# Patient Record
Sex: Male | Born: 1985
Health system: Southern US, Community
[De-identification: ages and names within clinical notes are randomized; demographics above are authoritative.]

## PROBLEM LIST (undated history)

## (undated) DIAGNOSIS — F329 Major depressive disorder, single episode, unspecified: Secondary | ICD-10-CM

## (undated) DIAGNOSIS — F32A Depression, unspecified: Secondary | ICD-10-CM

## (undated) HISTORY — DX: Depression, unspecified: F32.A

## (undated) HISTORY — PX: APPENDECTOMY: SHX54

## (undated) HISTORY — PX: WISDOM TOOTH EXTRACTION: SHX21

---
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
## 2011-07-28 ENCOUNTER — Emergency Department (HOSPITAL_COMMUNITY): Payer: BC Managed Care – PPO

## 2011-07-28 ENCOUNTER — Emergency Department (HOSPITAL_COMMUNITY)
Admission: EM | Admit: 2011-07-28 | Discharge: 2011-07-28 | Disposition: A | Discharge status: Home / Self Care | Payer: BC Managed Care – PPO | Attending: Emergency Medicine | Admitting: Emergency Medicine

## 2011-07-28 DIAGNOSIS — F17200 Nicotine dependence, unspecified, uncomplicated: Secondary | ICD-10-CM | POA: Insufficient documentation

## 2011-07-28 DIAGNOSIS — R1084 Generalized abdominal pain: Secondary | ICD-10-CM

## 2011-07-28 DIAGNOSIS — R1031 Right lower quadrant pain: Secondary | ICD-10-CM | POA: Insufficient documentation

## 2011-07-28 LAB — DIFFERENTIAL
Eosinophils Absolute: 0.1 10*3/uL (ref 0.0–0.7)
Eosinophils Relative: 2 % (ref 0–5)
Lymphocytes Relative: 18 % (ref 12–46)
Lymphs Abs: 1.5 10*3/uL (ref 0.7–4.0)
Monocytes Absolute: 1.2 10*3/uL — ABNORMAL HIGH (ref 0.1–1.0)
Monocytes Relative: 14 % — ABNORMAL HIGH (ref 3–12)

## 2011-07-28 LAB — CBC
HCT: 46.5 % (ref 39.0–52.0)
Hemoglobin: 16.3 g/dL (ref 13.0–17.0)
MCH: 31.5 pg (ref 26.0–34.0)
MCV: 89.8 fL (ref 78.0–100.0)
RBC: 5.18 MIL/uL (ref 4.22–5.81)
WBC: 8.1 10*3/uL (ref 4.0–10.5)

## 2011-07-28 LAB — URINALYSIS, ROUTINE W REFLEX MICROSCOPIC
Bilirubin Urine: NEGATIVE
Ketones, ur: NEGATIVE mg/dL
Nitrite: NEGATIVE
Specific Gravity, Urine: 1.03 (ref 1.005–1.030)
pH: 6 (ref 5.0–8.0)

## 2011-07-28 LAB — COMPREHENSIVE METABOLIC PANEL
ALT: 34 U/L (ref 0–53)
BUN: 7 mg/dL (ref 6–23)
CO2: 26 mEq/L (ref 19–32)
Calcium: 9.7 mg/dL (ref 8.4–10.5)
GFR calc Af Amer: >60 mL/min (ref >60)
GFR calc non Af Amer: >60 mL/min (ref >60)
Glucose, Bld: 88 mg/dL (ref 70–99)
Sodium: 135 mEq/L (ref 135–145)
Total Protein: 7.7 g/dL (ref 6.0–8.3)

## 2011-07-28 MED ORDER — HYDROCODONE-ACETAMINOPHEN 5-325 MG PO TABS: 1.0000 | ORAL_TABLET | ORAL | Status: AC | PRN

## 2011-07-28 MED ORDER — HYDROMORPHONE HCL 1 MG/ML IJ SOLN
1.0000 mg | Freq: Once | INTRAMUSCULAR | Status: AC
  Administered 2011-07-28: 1 mg via INTRAVENOUS
  Filled 2011-07-28: qty 1

## 2011-07-28 MED ORDER — ONDANSETRON HCL 4 MG/2ML IJ SOLN
4.0000 mg | Freq: Once | INTRAMUSCULAR | Status: AC
  Administered 2011-07-28: 4 mg via INTRAVENOUS
  Filled 2011-07-28: qty 2

## 2011-07-28 MED ORDER — HYDROCODONE-ACETAMINOPHEN 5-325 MG PO TABS: 1.0000 | ORAL_TABLET | ORAL | Status: DC | PRN

## 2011-07-28 MED ORDER — KETOROLAC TROMETHAMINE 30 MG/ML IJ SOLN
30.0000 mg | Freq: Once | INTRAMUSCULAR | Status: AC
  Administered 2011-07-28: 30 mg via INTRAVENOUS
  Filled 2011-07-28: qty 1

## 2011-07-28 MED ORDER — ONDANSETRON HCL 4 MG PO TABS: 4.0000 mg | ORAL_TABLET | Freq: Four times a day (QID) | ORAL | Status: AC

## 2011-07-28 MED ORDER — SODIUM CHLORIDE 0.9 % IV BOLUS (SEPSIS)
1000.0000 mL | Freq: Once | INTRAVENOUS | Status: AC
  Administered 2011-07-28: 1000 mL via INTRAVENOUS

## 2011-07-28 MED ORDER — IOHEXOL 300 MG/ML  SOLN
100.0000 mL | Freq: Once | INTRAMUSCULAR | Status: AC | PRN
  Administered 2011-07-28: 100 mL via INTRAVENOUS

## 2011-07-28 MED ORDER — ONDANSETRON HCL 4 MG PO TABS: 4.0000 mg | ORAL_TABLET | Freq: Four times a day (QID) | ORAL | Status: DC

## 2011-07-28 NOTE — ED Notes (Signed)
Complain of sharp pain in right lower abd

## 2011-07-28 NOTE — ED Notes (Signed)
Left in c/o mother for transport home; a&ox4; in no distress. 

## 2011-07-28 NOTE — ED Provider Notes (Signed)
History   Chart scribed for Donnetta Hutching, MD by Enos Fling; the patient was seen in room APA14/APA14; this patient's care was started at 8:40 AM.    CSN: [id number] Arrival date & time: 07/28/2011  8:19 AM  Chief Complaint  Patient presents with  . Abdominal Pain   HPI Connor Crawford is a 25 y.o. male who presents to the Emergency Department complaining of abd pain. Pt reports RLQ abd pain has been very mild for several days but became much worse yesterday evening when pt woke up from sleeping. Pain is sharp and radiating to right flank and to bilateral testicles. Pt eating normally, no n/v/d or urinary complaints. No fever, chills, diaphoresis.  History reviewed. No pertinent past medical history.  History reviewed. No pertinent past surgical history.  No family history on file.  History  Substance Use Topics  . Smoking status: Current Everyday Smoker  . Smokeless tobacco: Not on file  . Alcohol Use: Yes   Previous Medications   ASCORBIC ACID (VITAMIN C PO)    Take 4 tablets by mouth as needed. Takes when getting sick    DIPHENHYDRAMINE (BENADRYL) 25 MG CAPSULE    Take 50 mg by mouth 2 (two) times daily as needed. For allergies    IBUPROFEN (ADVIL,MOTRIN) 200 MG TABLET    Take 400-600 mg by mouth daily as needed. For pain      Allergies as of 07/28/2011  . (No Known Allergies)       Review of Systems 10 Systems reviewed and are negative for acute change except as noted in the HPI.  Physical Exam  BP 134/79  Pulse 63  Temp(Src) 98.6 F (37 C) (Oral)  Resp 18  Ht 5\' 11"  (1.803 m)  Wt 225 lb (102.059 kg)  BMI 31.38 kg/m2  SpO2 97%  Physical Exam  Nursing note and vitals reviewed. Constitutional: He is oriented to person, place, and time. No distress.       Appearance consistent with age of record  HENT:  Head: Normocephalic and atraumatic.  Right Ear: External ear normal.  Left Ear: External ear normal.  Nose: Nose normal.  Mouth/Throat: Oropharynx is clear  and moist.  Eyes: Conjunctivae are normal.  Neck: Neck supple.  Cardiovascular: Normal rate and regular rhythm.  Exam reveals no gallop and no friction rub.   No murmur heard. Pulmonary/Chest: Effort normal and breath sounds normal. He has no wheezes. He has no rhonchi. He has no rales. He exhibits no tenderness.  Abdominal: Soft. Bowel sounds are normal. There is tenderness (moderate RLQ). There is no rebound and no guarding.       Suprapubic and right flank are nontender  Musculoskeletal: Normal range of motion.       Normal appearance of extremities  Neurological: He is alert and oriented to person, place, and time. No sensory deficit.  Skin: No rash noted.       Color normal  Psychiatric: He has a normal mood and affect.    ED Course  Procedures  OTHER DATA REVIEWED: Nursing notes and vital signs reviewed. Prior records reviewed.   LABS / RADIOLOGY: Results for orders placed during the hospital encounter of 07/28/11  URINALYSIS, ROUTINE W REFLEX MICROSCOPIC      Component Value Range   Color, Urine YELLOW  YELLOW    Appearance CLEAR  CLEAR    Specific Gravity, Urine 1.030  1.005 - 1.030    pH 6.0  5.0 - 8.0    Glucose,  UA NEGATIVE  NEGATIVE (mg/dL)   Hgb urine dipstick TRACE (*) NEGATIVE    Bilirubin Urine NEGATIVE  NEGATIVE    Ketones, ur NEGATIVE  NEGATIVE (mg/dL)   Protein, ur NEGATIVE  NEGATIVE (mg/dL)   Urobilinogen, UA 0.2  0.0 - 1.0 (mg/dL)   Nitrite NEGATIVE  NEGATIVE    Leukocytes, UA NEGATIVE  NEGATIVE   URINE MICROSCOPIC-ADD ON      Component Value Range   RBC / HPF 0-2  <3 (RBC/hpf)  CBC      Component Value Range   WBC 8.1  4.0 - 10.5 (K/uL)   RBC 5.18  4.22 - 5.81 (MIL/uL)   Hemoglobin 16.3  13.0 - 17.0 (g/dL)   HCT 16.1  09.6 - 04.5 (%)   MCV 89.8  78.0 - 100.0 (fL)   MCH 31.5  26.0 - 34.0 (pg)   MCHC 35.1  30.0 - 36.0 (g/dL)   RDW 40.9  81.1 - 91.4 (%)   Platelets 227  150 - 400 (K/uL)  DIFFERENTIAL      Component Value Range   Neutrophils  Relative 66  43 - 77 (%)   Neutro Abs 5.4  1.7 - 7.7 (K/uL)   Lymphocytes Relative 18  12 - 46 (%)   Lymphs Abs 1.5  0.7 - 4.0 (K/uL)   Monocytes Relative 14 (*) 3 - 12 (%)   Monocytes Absolute 1.2 (*) 0.1 - 1.0 (K/uL)   Eosinophils Relative 2  0 - 5 (%)   Eosinophils Absolute 0.1  0.0 - 0.7 (K/uL)   Basophils Relative 0  0 - 1 (%)   Basophils Absolute 0.0  0.0 - 0.1 (K/uL)  COMPREHENSIVE METABOLIC PANEL      Component Value Range   Sodium 135  135 - 145 (mEq/L)   Potassium 3.8  3.5 - 5.1 (mEq/L)   Chloride 99  96 - 112 (mEq/L)   CO2 26  19 - 32 (mEq/L)   Glucose, Bld 88  70 - 99 (mg/dL)   BUN 7  6 - 23 (mg/dL)   Creatinine, Ser 7.82  0.50 - 1.35 (mg/dL)   Calcium 9.7  8.4 - 95.6 (mg/dL)   Total Protein 7.7  6.0 - 8.3 (g/dL)   Albumin 4.1  3.5 - 5.2 (g/dL)   AST 32  0 - 37 (U/L)   ALT 34  0 - 53 (U/L)   Alkaline Phosphatase 85  39 - 117 (U/L)   Total Bilirubin 0.4  0.3 - 1.2 (mg/dL)   GFR calc non Af Amer >60  >60 (mL/min)   GFR calc Af Amer >60  >60 (mL/min)   Ct Abdomen Pelvis W Contrast  07/28/2011  *RADIOLOGY REPORT*  Clinical Data: Right lower quadrant pain/tenderness, evaluate for appendicitis versus kidney stone  CT ABDOMEN AND PELVIS WITH CONTRAST  Technique:  Multidetector CT imaging of the abdomen and pelvis was performed following the standard protocol during bolus administration of intravenous contrast.  Contrast: 100 ml Omnipaque-300 IV  Comparison: None.  Findings: Lung bases are clear.  Liver, spleen, pancreas, and adrenal glands within normal limits.  Gallbladder is unremarkable.  No intrahepatic or extrahepatic ductal dilatation.  Kidneys within normal limits.  No hydronephrosis.  No evidence of bowel obstruction.  Normal appendix.  Atherosclerotic calcifications of the abdominal aorta and branch vessels.  No abdominopelvic ascites.  No suspicious abdominopelvic lymphadenopathy.  Prostate is unremarkable.  No ureteral or bladder calculi.  Visualized osseous structures  are within normal limits.  IMPRESSION: Normal appendix.  No renal, ureteral, or bladder calculi.  No hydronephrosis.  No evidence of bowel obstruction.  No CT findings to account for the patient's abdominal pain.  Original Report Authenticated By: Charline Bills, M.D.     ED COURSE: 1:00 PM - Pt resting comfortably, pain improved, no complaints. All results reviewed and discussed with pt, questions answered, pt agreeable with plan.  MDM: Blood work, UA, CT scan, IVFs 1:34 PM recheck no acute abdomen. Patient comfortable. Discussed test results with patient. IMPRESSION: No diagnosis found.   PLAN: discharge    CONDITION ON DISCHARGE: stable   MEDS GIVEN IN ED: HYDROmorphone (DILAUDID) injection 1 mg (1 mg Intravenous Given 07/28/11 1008)  ketorolac (TORADOL) injection 30 mg (30 mg Intravenous Given 07/28/11 1007)  ondansetron (ZOFRAN) injection 4 mg (4 mg Intravenous Given 07/28/11 1007)  sodium chloride 0.9 % bolus 1,000 mL (1000 mL Intravenous Given 07/28/11 1006)  iohexol (OMNIPAQUE) 300 MG/ML injection 100 mL (100 mL Intravenous Contrast Given 07/28/11 1057)     DISCHARGE MEDICATIONS: New Prescriptions   No medications on file     SCRIBE ATTESTATION:I personally performed the services described in this documentation, which was scribed in my presence. The recorded information has been reviewed and considered. No att. providers found    Donnetta Hutching, MD 07/29/11 2143

## 2013-03-08 IMAGING — CT CT ABD-PELV W/ CM
2 of 3 series · 16 of 46 positions shown, 18 images · IV contrast (Omnipaque 300)
Comparison: None.

CLINICAL DATA: Right lower quadrant pain/tenderness, evaluate for
appendicitis versus kidney stone

CT ABDOMEN AND PELVIS WITH CONTRAST
TECHNIQUE: Multidetector CT imaging of the abdomen and pelvis was
performed following the standard protocol during bolus
administration of intravenous contrast.
Contrast: 100 ml Tmnipaque-WLL IV

[Series 2: abd_pel_with 5.0 b40f · axial · 0.72mm/px · z∈[+426,+866]mm · 13 of 102 slices shown, 15 images]
[im 7/102  soft-tissue]
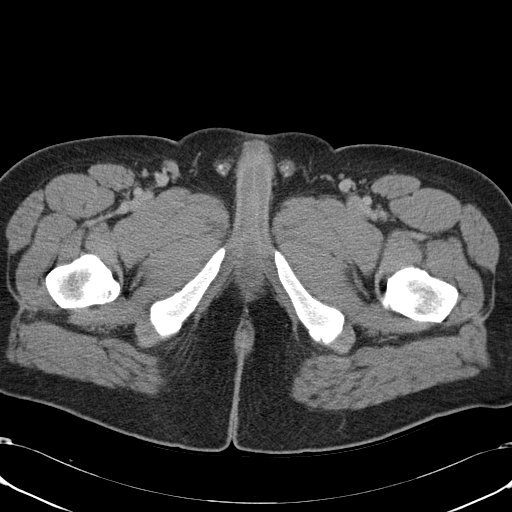
[im 7/102  bone]
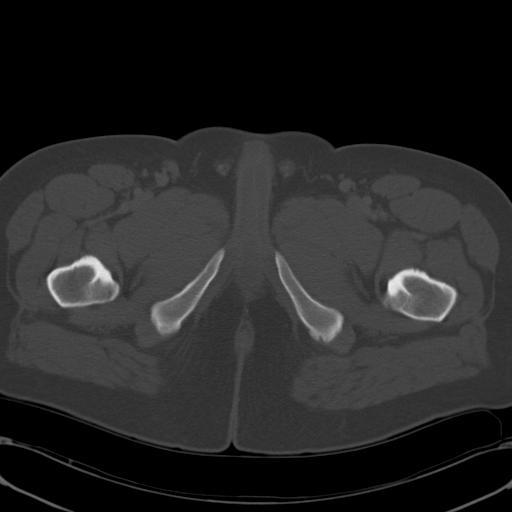
[im 14/102  soft-tissue]
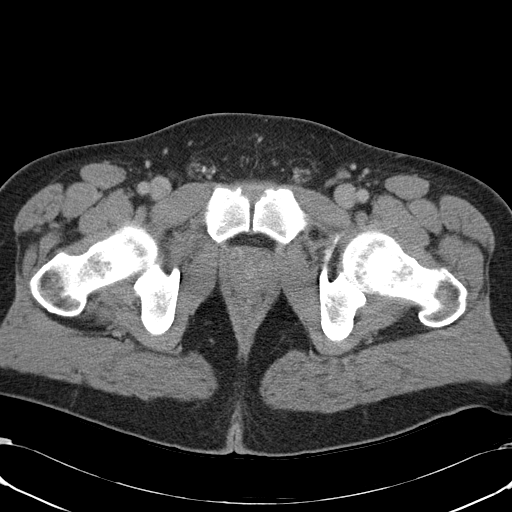
[im 20/102  soft-tissue]
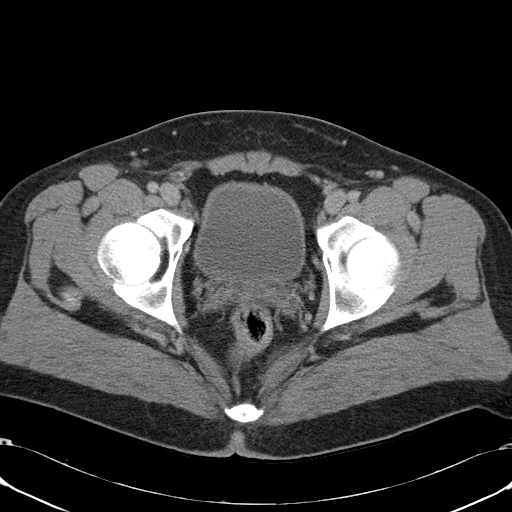
[im 30/102  soft-tissue]
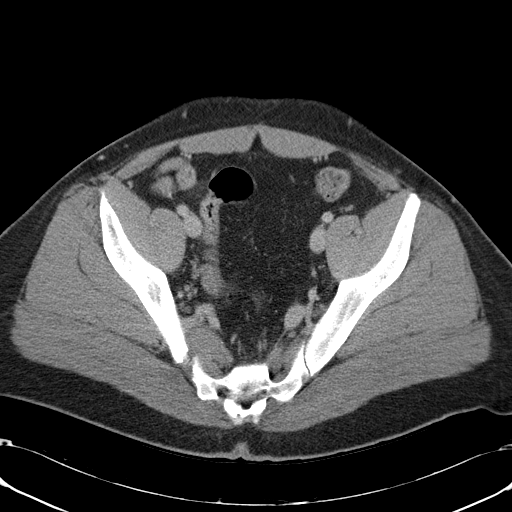
[im 36/102  soft-tissue]
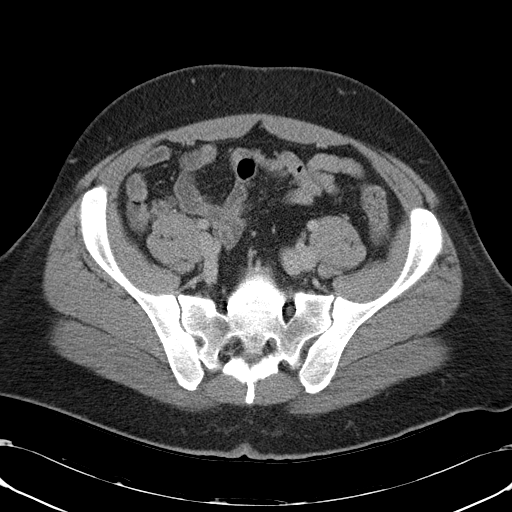
[im 43/102  soft-tissue]
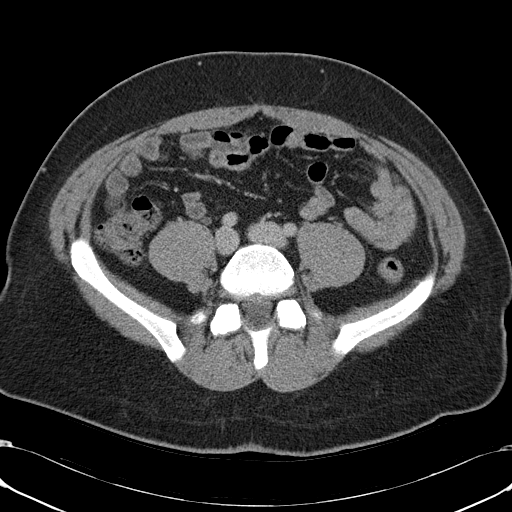
[im 53/102  soft-tissue]
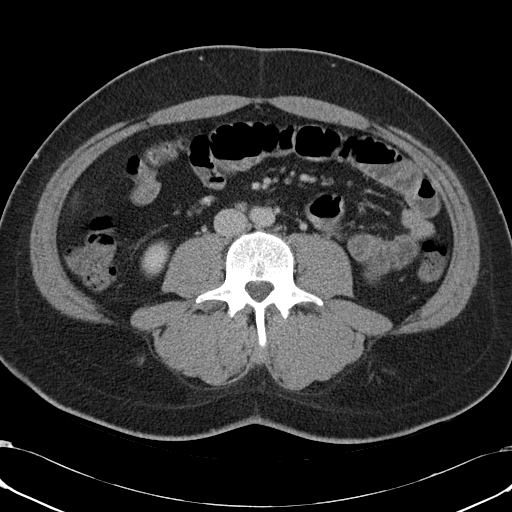
[im 59/102  soft-tissue]
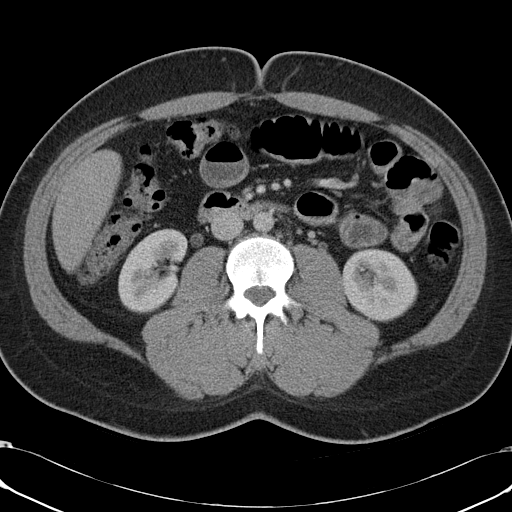
[im 66/102  soft-tissue]
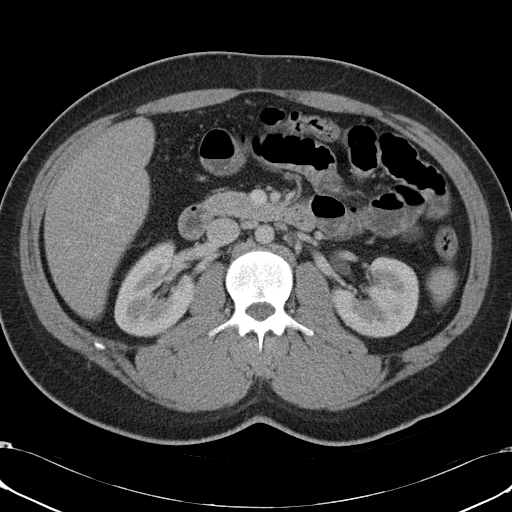
[im 66/102  bone]
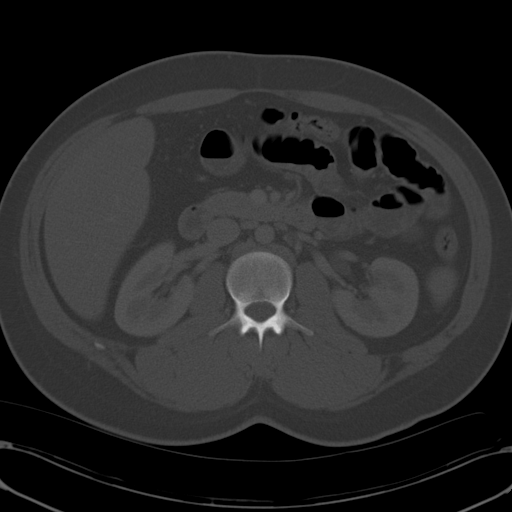
[im 72/102  soft-tissue]
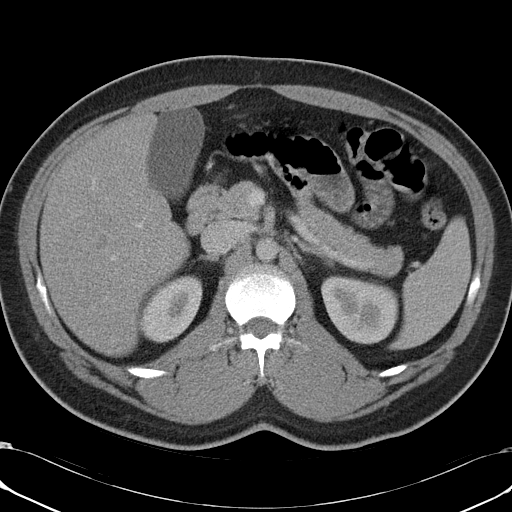
[im 82/102  soft-tissue]
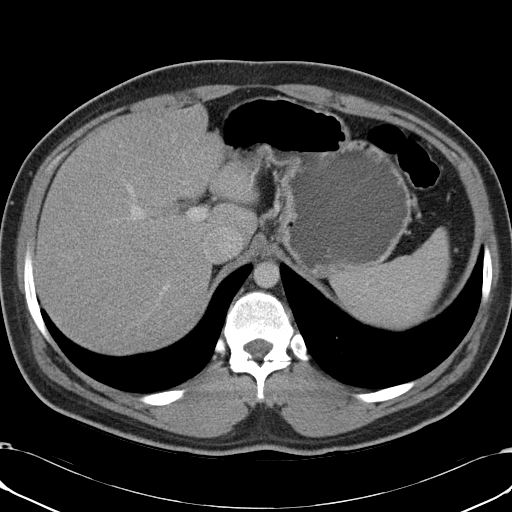
[im 88/102  soft-tissue]
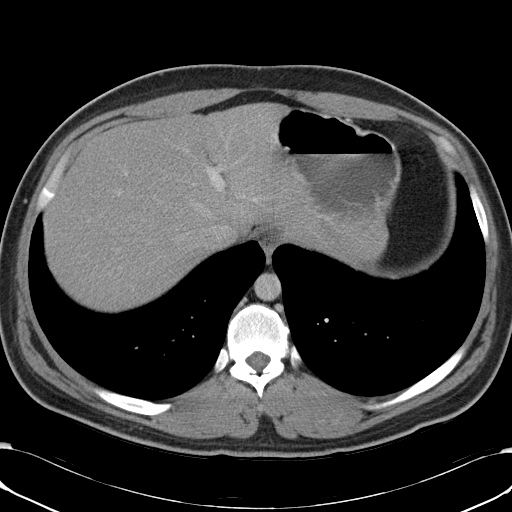
[im 95/102  soft-tissue]
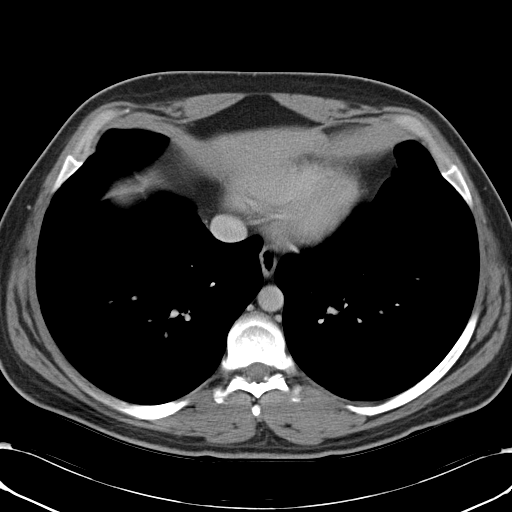

[Series 4: abd_pel_with 3.0 spo cor · coronal · 0.72mm/px · 3 of 91 slices shown]
[im 31/91  soft-tissue]
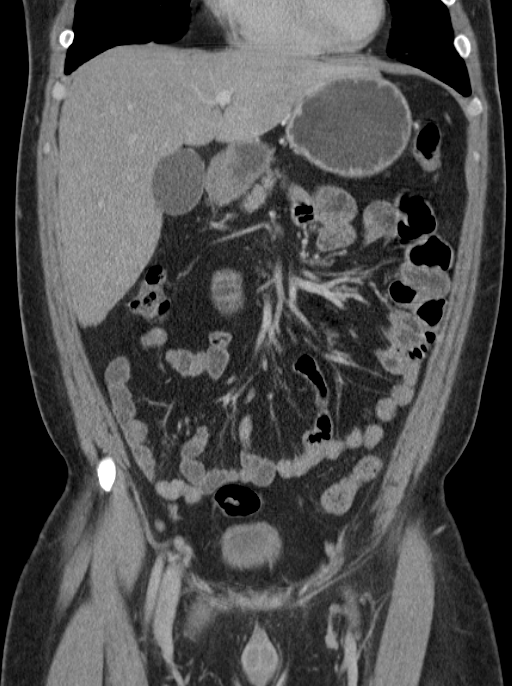
[im 41/91  soft-tissue]
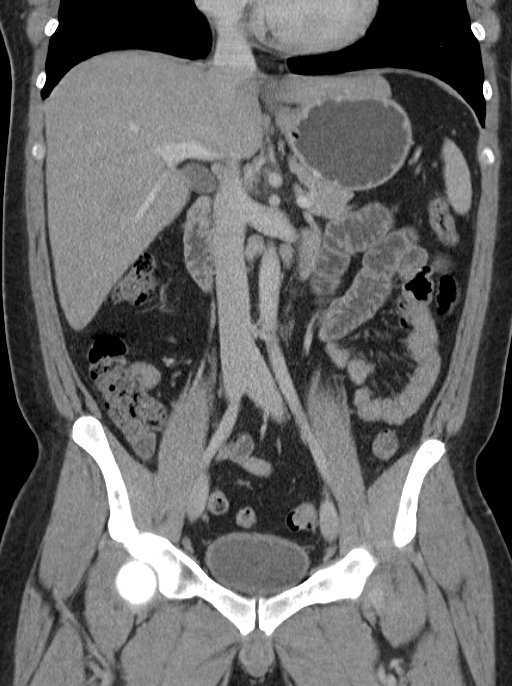
[im 51/91  soft-tissue]
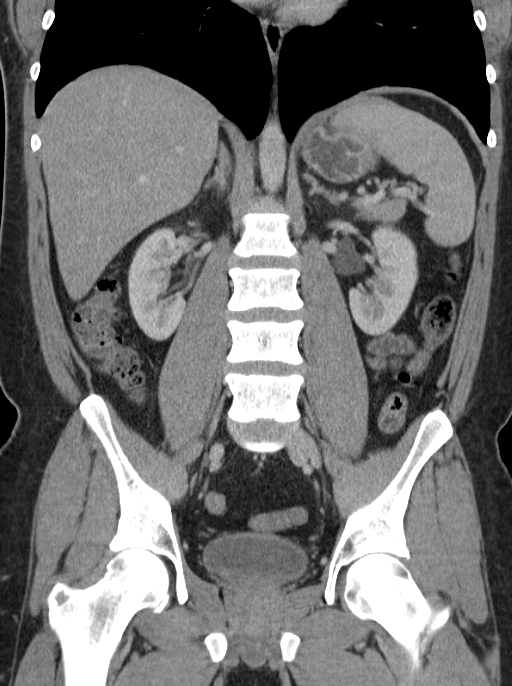

[16 of 46 positions shown; findings below may reference images not displayed]

FINDINGS: Lung bases are clear.

Liver, spleen, pancreas, and adrenal glands within normal limits.

Gallbladder is unremarkable.  No intrahepatic or extrahepatic
ductal dilatation.

Kidneys within normal limits.  No hydronephrosis.

No evidence of bowel obstruction.  Normal appendix.

Atherosclerotic calcifications of the abdominal aorta and branch
vessels.

No abdominopelvic ascites.

No suspicious abdominopelvic lymphadenopathy.

Prostate is unremarkable.

No ureteral or bladder calculi.

Visualized osseous structures are within normal limits.
IMPRESSION: Normal appendix.

No renal, ureteral, or bladder calculi.  No hydronephrosis.

No evidence of bowel obstruction.

No CT findings to account for the patient's abdominal pain.

## 2018-07-04 ENCOUNTER — Encounter: Payer: Self-pay | Admitting: Physician Assistant

## 2018-07-04 ENCOUNTER — Ambulatory Visit (INDEPENDENT_AMBULATORY_CARE_PROVIDER_SITE_OTHER): Payer: Self-pay | Admitting: Physician Assistant

## 2018-07-04 VITALS — BP 138/82 | HR 75 | Temp 98.9°F | Ht 71.0 in | Wt 169.0 lb

## 2018-07-04 DIAGNOSIS — F419 Anxiety disorder, unspecified: Secondary | ICD-10-CM | POA: Insufficient documentation

## 2018-07-04 DIAGNOSIS — F339 Major depressive disorder, recurrent, unspecified: Secondary | ICD-10-CM | POA: Insufficient documentation

## 2018-07-04 DIAGNOSIS — G5603 Carpal tunnel syndrome, bilateral upper limbs: Secondary | ICD-10-CM | POA: Insufficient documentation

## 2018-07-04 MED ORDER — MELOXICAM 7.5 MG PO TABS: 7.5000 mg | ORAL_TABLET | Freq: Every day | ORAL | 0 refills | Status: DC

## 2018-07-04 MED ORDER — CITALOPRAM HYDROBROMIDE 20 MG PO TABS: 20.0000 mg | ORAL_TABLET | Freq: Every day | ORAL | 5 refills | Status: DC

## 2018-07-04 NOTE — Progress Notes (Signed)
BP 138/82   Pulse 75   Temp 98.9 F (37.2 C) (Oral)   Ht 5\' 11"  (1.803 m)   Wt 169 lb (76.7 kg)   BMI 23.57 kg/m    Subjective:    Patient ID: Connor Crawford, male    DOB: 25-Apr-1986, 32 y.o.   MRN: [id number]  HPI: Connor Crawford is a 32 y.o. male presenting on 07/04/2018 for Anxiety and Depression This patient comes in as a new patient to be established with.  He has long-standing issues for depression and anxiety.  He states that he is known that he has had this for probably more than 5 years but due to insurance purposes had never been able to be seen.  He states that he is currently using marijuana nearly daily.  And he will occasionally use pain pills or alcohol.  We had a long discussion that all of these are likely to lower his depression and he should avoid using them as much as possible.  He also has very persistent anxiety.  There are some family history of depression and anxiety.  Is very interested in having treatment.  Depression screen PHQ 2/9 07/04/2018  Decreased Interest 2  Down, Depressed, Hopeless 3  PHQ - 2 Score 5  Altered sleeping 1  Tired, decreased energy 1  Change in appetite 2  Feeling bad or failure about yourself  2  Trouble concentrating 1  Moving slowly or fidgety/restless 1  Suicidal thoughts 2  PHQ-9 Score 15   The patient also reports persistent daily pain in his hands and wrists.  And will be numb and Tingling at nighttime.  I have encouraged him to wear a wrist splint as much as possible.  He does do a job with very repetitious hand movements.  He has never sought treatment for this.  History reviewed. No pertinent past medical history. Relevant past medical, surgical, family and social history reviewed and updated as indicated. Interim medical history since our last visit reviewed. Allergies and medications reviewed and updated. DATA REVIEWED: CHART IN EPIC  Family History reviewed for pertinent findings.  Review of Systems  Constitutional:  Negative.  Negative for appetite change and fatigue.  Eyes: Negative for pain and visual disturbance.  Respiratory: Negative.  Negative for cough, chest tightness, shortness of breath and wheezing.   Cardiovascular: Negative.  Negative for chest pain, palpitations and leg swelling.  Gastrointestinal: Negative.  Negative for abdominal pain, diarrhea, nausea and vomiting.  Genitourinary: Negative.   Musculoskeletal: Positive for arthralgias and joint swelling.  Skin: Negative.  Negative for color change and rash.  Neurological: Negative.  Negative for weakness, numbness and headaches.  Psychiatric/Behavioral: Positive for decreased concentration and dysphoric mood. The patient is nervous/anxious.     Allergies as of 07/04/2018   No Known Allergies     Medication List        Accurate as of 07/04/18  1:07 PM. Always use your most recent med list.          BENADRYL 25 mg capsule Generic drug:  diphenhydrAMINE Take 50 mg by mouth 2 (two) times daily as needed. For allergies   citalopram 20 MG tablet Commonly known as:  CELEXA Take 1 tablet (20 mg total) by mouth daily.   ibuprofen 200 MG tablet Commonly known as:  ADVIL,MOTRIN Take 400-600 mg by mouth daily as needed. For pain   meloxicam 7.5 MG tablet Commonly known as:  MOBIC Take 1 tablet (7.5 mg total) by mouth  daily.   VITAMIN C PO Take 4 tablets by mouth as needed. Takes when getting sick          Objective:    BP 138/82   Pulse 75   Temp 98.9 F (37.2 C) (Oral)   Ht 5\' 11"  (1.803 m)   Wt 169 lb (76.7 kg)   BMI 23.57 kg/m   No Known Allergies  Wt Readings from Last 3 Encounters:  07/04/18 169 lb (76.7 kg)  07/28/11 225 lb (102.1 kg)    Physical Exam  Constitutional: He appears well-developed and well-nourished. No distress.  HENT:  Head: Normocephalic and atraumatic.  Eyes: Pupils are equal, round, and reactive to light. Conjunctivae and EOM are normal.  Cardiovascular: Normal rate, regular rhythm and  normal heart sounds.  Pulmonary/Chest: Effort normal and breath sounds normal. No respiratory distress.  Musculoskeletal:       Right hand: He exhibits tenderness. He exhibits normal range of motion. Normal sensation noted. Normal strength noted.  Positive Phalen's  Skin: Skin is warm and dry.  Psychiatric: He has a normal mood and affect. His behavior is normal.  Nursing note and vitals reviewed.       Assessment & Plan:   1. Depression, recurrent (HCC) - citalopram (CELEXA) 20 MG tablet; Take 1 tablet (20 mg total) by mouth daily.  Dispense: 30 tablet; Refill: 5  2. Carpal tunnel syndrome on both sides - meloxicam (MOBIC) 7.5 MG tablet; Take 1 tablet (7.5 mg total) by mouth daily.  Dispense: 30 tablet; Refill: 0  3. Anxiety - citalopram (CELEXA) 20 MG tablet; Take 1 tablet (20 mg total) by mouth daily.  Dispense: 30 tablet; Refill: 5   Continue all other maintenance medications as listed above.  Follow up plan: Return in about 1 month (around 08/01/2018).  Educational handout given for survey  Remus LofflerAngel S. Anita Laguna PA-C [facility] [address]  [place], [place] [id number] [phone]   07/04/2018, 1:07 PM

## 2018-07-04 NOTE — Patient Instructions (Signed)

## 2018-07-06 ENCOUNTER — Ambulatory Visit: Payer: Self-pay | Admitting: Physician Assistant

## 2018-08-03 ENCOUNTER — Other Ambulatory Visit: Payer: Self-pay | Admitting: Physician Assistant

## 2018-08-03 DIAGNOSIS — G5603 Carpal tunnel syndrome, bilateral upper limbs: Secondary | ICD-10-CM

## 2018-08-08 ENCOUNTER — Ambulatory Visit: Payer: Self-pay | Admitting: Physician Assistant

## 2018-08-09 ENCOUNTER — Encounter: Payer: Self-pay | Admitting: Physician Assistant

## 2018-08-17 ENCOUNTER — Encounter: Payer: Self-pay | Admitting: Physician Assistant

## 2018-08-17 ENCOUNTER — Ambulatory Visit (INDEPENDENT_AMBULATORY_CARE_PROVIDER_SITE_OTHER): Payer: Self-pay | Admitting: Physician Assistant

## 2018-08-17 VITALS — BP 139/89 | HR 70 | Temp 98.3°F | Ht 71.0 in | Wt 174.0 lb

## 2018-08-17 DIAGNOSIS — F339 Major depressive disorder, recurrent, unspecified: Secondary | ICD-10-CM

## 2018-08-17 NOTE — Patient Instructions (Signed)
In a few days you may receive a survey in the mail or online from [employer] regarding your visit with us today. Please take a moment to fill this out. Your feedback is very important to our whole office. It can help us better understand your needs as well as improve your experience and satisfaction. Thank you for taking your time to complete it. We care about you.  Favor Kreh, PA-C  

## 2018-08-17 NOTE — Progress Notes (Signed)
BP 139/89   Pulse 70   Temp 98.3 F (36.8 C) (Oral)   Ht 5\' 11"  (1.803 m)   Wt 174 lb (78.9 kg)   BMI 24.27 kg/m    Subjective:    Patient ID: Connor Crawford, male    DOB: 17-Jul-1986, 32 y.o.   MRN: [id number]  HPI: Connor Crawford is a 32 y.o. male presenting on 08/17/2018 for Depression (1 month ) This patient returns for a recheck on jis depression. He reports that he has had an improvement in how he feels overall and wants to continue his medications. He does not want to change anything at this time. Depression screen [facility] 2/9 08/17/2018 07/04/2018  Decreased Interest 2 2  Down, Depressed, Hopeless 1 3  PHQ - 2 Score 3 5  Altered sleeping 1 1  Tired, decreased energy 1 1  Change in appetite 3 2  Feeling bad or failure about yourself  2 2  Trouble concentrating 1 1  Moving slowly or fidgety/restless 0 1  Suicidal thoughts 1 2  PHQ-9 Score 12 15      History reviewed. No pertinent past medical history. Relevant past medical, surgical, family and social history reviewed and updated as indicated. Interim medical history since our last visit reviewed. Allergies and medications reviewed and updated. DATA REVIEWED: CHART IN EPIC  Family History reviewed for pertinent findings.  Review of Systems  Constitutional: Negative.  Negative for appetite change and fatigue.  Eyes: Negative for pain and visual disturbance.  Respiratory: Negative.  Negative for cough, chest tightness, shortness of breath and wheezing.   Cardiovascular: Negative.  Negative for chest pain, palpitations and leg swelling.  Gastrointestinal: Negative.  Negative for abdominal pain, diarrhea, nausea and vomiting.  Genitourinary: Negative.   Skin: Negative.  Negative for color change and rash.  Neurological: Negative.  Negative for weakness, numbness and headaches.  Psychiatric/Behavioral: Negative.     Allergies as of 08/17/2018   No Known Allergies     Medication List        Accurate as of 08/17/18  11:59 PM. Always use your most recent med list.          BENADRYL 25 mg capsule Generic drug:  diphenhydrAMINE Take 50 mg by mouth 2 (two) times daily as needed. For allergies   citalopram 20 MG tablet Commonly known as:  CELEXA Take 1 tablet (20 mg total) by mouth daily.   ibuprofen 200 MG tablet Commonly known as:  ADVIL,MOTRIN Take 400-600 mg by mouth daily as needed. For pain   meloxicam 7.5 MG tablet Commonly known as:  MOBIC TAKE 1 TABLET BY MOUTH ONCE DAILY   VITAMIN C PO Take 4 tablets by mouth as needed. Takes when getting sick          Objective:    BP 139/89   Pulse 70   Temp 98.3 F (36.8 C) (Oral)   Ht 5\' 11"  (1.803 m)   Wt 174 lb (78.9 kg)   BMI 24.27 kg/m   No Known Allergies  Wt Readings from Last 3 Encounters:  08/17/18 174 lb (78.9 kg)  07/04/18 169 lb (76.7 kg)  07/28/11 225 lb (102.1 kg)    Physical Exam  Constitutional: He appears well-developed and well-nourished. No distress.  HENT:  Head: Normocephalic and atraumatic.  Eyes: Pupils are equal, round, and reactive to light. Conjunctivae and EOM are normal.  Cardiovascular: Normal rate, regular rhythm and normal heart sounds.  Pulmonary/Chest: Effort normal and breath  sounds normal. No respiratory distress.  Skin: Skin is warm and dry.  Psychiatric: He has a normal mood and affect. His behavior is normal.  Nursing note and vitals reviewed.       Assessment & Plan:   1. Depression, recurrent (HCC) Continue celexa and recheck 1 month   Continue all other maintenance medications as listed above.  Follow up plan: No follow-ups on file.  Educational handout given for survey  Remus LofflerAngel S. Crissie Aloi PA-C [facility] [address]  [place], [place] [address] [phone]   08/18/2018, 9:45 PM

## 2018-08-26 ENCOUNTER — Telehealth: Payer: Self-pay | Admitting: Physician Assistant

## 2018-08-26 NOTE — Telephone Encounter (Signed)
[name] not in today - attempted to call mom and let her know that it will be MON = NA , No VM -jhb 08/26/18.

## 2018-08-28 ENCOUNTER — Other Ambulatory Visit: Payer: Self-pay | Admitting: Physician Assistant

## 2018-08-28 DIAGNOSIS — F339 Major depressive disorder, recurrent, unspecified: Secondary | ICD-10-CM

## 2018-08-28 DIAGNOSIS — F419 Anxiety disorder, unspecified: Secondary | ICD-10-CM

## 2018-08-28 MED ORDER — CITALOPRAM HYDROBROMIDE 40 MG PO TABS: 20.0000 mg | ORAL_TABLET | Freq: Every day | ORAL | 5 refills | Status: DC

## 2018-08-28 NOTE — Telephone Encounter (Signed)
sent 

## 2018-08-29 MED ORDER — CITALOPRAM HYDROBROMIDE 40 MG PO TABS: 40.0000 mg | ORAL_TABLET | Freq: Every day | ORAL | 5 refills | Status: DC

## 2018-08-29 NOTE — Telephone Encounter (Signed)
Patient aware.

## 2018-09-26 ENCOUNTER — Telehealth: Payer: Self-pay | Admitting: Physician Assistant

## 2018-09-26 NOTE — Telephone Encounter (Signed)
Patients mother called in stating patient is threatening to kill himself. She states that there is one gun in the home but she has it hidden and in several pieces. He does have a knife collection scattered all through the home. She states he has been drinking more and using pain killers more as well. He lives in the basement with his mother. He has 4 pit bull dogs in the home with him. I advised mother that she needed to transport him to the hospital and she said he would refuse to go. I advised her that she could call 911 and if she was not comfortable that I would call 911 for her. 911 called at 11:22am and information given to dispatch and they have officers on the way to the home now. Mother advised.

## 2018-09-27 ENCOUNTER — Ambulatory Visit (INDEPENDENT_AMBULATORY_CARE_PROVIDER_SITE_OTHER): Payer: Self-pay | Admitting: Physician Assistant

## 2018-09-27 ENCOUNTER — Telehealth: Payer: Self-pay | Admitting: Physician Assistant

## 2018-09-27 ENCOUNTER — Encounter: Payer: Self-pay | Admitting: Physician Assistant

## 2018-09-27 VITALS — BP 142/92 | HR 84 | Ht 71.0 in | Wt 179.0 lb

## 2018-09-27 DIAGNOSIS — F39 Unspecified mood [affective] disorder: Secondary | ICD-10-CM

## 2018-09-27 DIAGNOSIS — F339 Major depressive disorder, recurrent, unspecified: Secondary | ICD-10-CM

## 2018-09-27 MED ORDER — RISPERIDONE 0.5 MG PO TABS: 0.5000 mg | ORAL_TABLET | Freq: Every day | ORAL | 1 refills | Status: DC

## 2018-09-27 NOTE — Progress Notes (Signed)
MOOD DISORDER QUESTIONNAIRE  Has there ever been a period of time when you were not your usual self and ... - you felt so good or so hyper that other people thought you were not your normal self or you were so hyper that you got into trouble?  No  - you were so irritable that you shouted at people or started fights or arguments? Yes  - you felt much more self-confident than usual? Yes  - you got much less sleep than usual and found that you didn't really miss it? Yes  - you were more talkative or spoke much faster than usual? Yes  - thoughts raced through your head or you couldn't slow your mind down? Yes  - you were so easily distracted by things around you that you had trouble concentrating or stay on track? Yes  -you had much more energy than usual? Yes  - you were much more active or did many more things than usual? Yes  - you were much more social or outgoing than usual, for example, you telephoned friends in the middle of the night? No  -you were much more interested in sex than usual? No  -you did things that were unusual for you or that other people might have thought were excessive, foolish or risky? Yes  -spending money got you or your family in trouble? Yes     If you checked yes for more than one of the above, have several of these ever happened during the same period of time? Yes  How much of a problem did any of these cause you- like being able to work; having family, money or legal troubles; getting in arguments or fights? yes  Have any of your blood relatives had manic-depressive illness or bipolar disorder?  Yes   Has a health professional ever told you that you have manic-depressive illness or bipolar disorder? No   Depression screen [facility] 2/9 09/27/2018 08/17/2018 07/04/2018  Decreased Interest 3 2 2   Down, Depressed, Hopeless 3 1 3   PHQ - 2 Score 6 3 5   Altered sleeping 2 1 1   Tired, decreased energy 1 1 1   Change in appetite 2 3 2   Feeling bad or failure  about yourself  3 2 2   Trouble concentrating 1 1 1   Moving slowly or fidgety/restless 1 0 1  Suicidal thoughts 2 1 2   PHQ-9 Score 18 12 15      BP (!) 142/92   Pulse 84   Ht 5\' 11"  (1.803 m)   Wt 179 lb (81.2 kg)   BMI 24.97 kg/m    Subjective:    Patient ID: Connor Crawford, male    DOB: January 01, 1986, 32 y.o.   MRN: [id number]  HPI: Connor Crawford is a 32 y.o. male presenting on 09/27/2018 for Depression (Feels like celexa is making it worse.) Positive MDQ and PHQ screen in today's visit. He commits to safety and will call mental health number or allow fa,ily to call if he feesl unsafe  History reviewed. No pertinent past medical history. Relevant past medical, surgical, family and social history reviewed and updated as indicated. Interim medical history since our last visit reviewed. Allergies and medications reviewed and updated. DATA REVIEWED: CHART IN EPIC  Family History reviewed for pertinent findings.  Review of Systems  Constitutional: Negative.  Negative for appetite change and fatigue.  Eyes: Negative for pain and visual disturbance.  Respiratory: Negative.  Negative for cough, chest tightness, shortness of breath and wheezing.  Cardiovascular: Negative.  Negative for chest pain, palpitations and leg swelling.  Gastrointestinal: Negative.  Negative for abdominal pain, diarrhea, nausea and vomiting.  Genitourinary: Negative.   Skin: Negative.  Negative for color change and rash.  Neurological: Negative.  Negative for weakness, numbness and headaches.  Psychiatric/Behavioral: Positive for decreased concentration, dysphoric mood, sleep disturbance and suicidal ideas. The patient is nervous/anxious.     Allergies as of 09/27/2018   No Known Allergies     Medication List        Accurate as of 09/27/18 11:59 PM. Always use your most recent med list.          BENADRYL 25 mg capsule Generic drug:  diphenhydrAMINE Take 50 mg by mouth 2 (two) times daily as needed.  For allergies   citalopram 40 MG tablet Commonly known as:  CELEXA Take 1 tablet (40 mg total) by mouth daily.   ibuprofen 200 MG tablet Commonly known as:  ADVIL,MOTRIN Take 400-600 mg by mouth daily as needed. For pain   meloxicam 7.5 MG tablet Commonly known as:  MOBIC TAKE 1 TABLET BY MOUTH ONCE DAILY   risperiDONE 0.5 MG tablet Commonly known as:  RISPERDAL Take 1 tablet (0.5 mg total) by mouth at bedtime.   VITAMIN C PO Take 4 tablets by mouth as needed. Takes when getting sick          Objective:    BP (!) 142/92   Pulse 84   Ht 5\' 11"  (1.803 m)   Wt 179 lb (81.2 kg)   BMI 24.97 kg/m   No Known Allergies  Wt Readings from Last 3 Encounters:  09/27/18 179 lb (81.2 kg)  08/17/18 174 lb (78.9 kg)  07/04/18 169 lb (76.7 kg)    Physical Exam  Constitutional: He appears well-developed and well-nourished. No distress.  HENT:  Head: Normocephalic and atraumatic.  Eyes: Pupils are equal, round, and reactive to light. Conjunctivae and EOM are normal.  Cardiovascular: Normal rate, regular rhythm and normal heart sounds.  Pulmonary/Chest: Effort normal and breath sounds normal. No respiratory distress.  Skin: Skin is warm and dry.  Psychiatric: His speech is normal. He is slowed. He exhibits a depressed mood.  Nursing note and vitals reviewed.       Assessment & Plan:   1. Depression, recurrent (HCC) - risperiDONE (RISPERDAL) 0.5 MG tablet; Take 1 tablet (0.5 mg total) by mouth at bedtime.  Dispense: 60 tablet; Refill: 1  2. Mood disorder (HCC) - risperiDONE (RISPERDAL) 0.5 MG tablet; Take 1 tablet (0.5 mg total) by mouth at bedtime.  Dispense: 60 tablet; Refill: 1   Continue all other maintenance medications as listed above.  Follow up plan: Return in about 2 weeks (around 10/11/2018) for recheck.  Educational handout given for survey  Remus Loffler PA-C [facility] [address]  [place], [place]  [address] [phone]   09/29/2018, 9:57 PM

## 2018-09-27 NOTE — Telephone Encounter (Signed)
Appointment given for today.

## 2018-09-29 DIAGNOSIS — F39 Unspecified mood [affective] disorder: Secondary | ICD-10-CM | POA: Insufficient documentation

## 2018-10-11 ENCOUNTER — Encounter: Payer: Self-pay | Admitting: Physician Assistant

## 2018-10-11 ENCOUNTER — Ambulatory Visit (INDEPENDENT_AMBULATORY_CARE_PROVIDER_SITE_OTHER): Payer: Self-pay | Admitting: Physician Assistant

## 2018-10-11 DIAGNOSIS — F339 Major depressive disorder, recurrent, unspecified: Secondary | ICD-10-CM

## 2018-10-11 DIAGNOSIS — F39 Unspecified mood [affective] disorder: Secondary | ICD-10-CM

## 2018-10-11 MED ORDER — RISPERIDONE 2 MG PO TABS: 2.0000 mg | ORAL_TABLET | Freq: Every day | ORAL | 5 refills | Status: DC

## 2018-10-11 NOTE — Progress Notes (Signed)
BP 132/87   Pulse 68   Temp (!) 97 F (36.1 C) (Oral)   Ht 5\' 11"  (1.803 m)   Wt 179 lb (81.2 kg)   BMI 24.97 kg/m    Subjective:    Patient ID: [name], male    DOB: [birth year], 32 y.o.   MRN: [id number]  HPI: Connor Crawford is a 32 y.o. male presenting on 10/11/2018 for Depression (2 week follow up ) Patient reports a great turnaround in his overall depression and anxiety feelings.  He is sleeping much better, though he still works a nighttime shift.  He is getting blocks of 6 hours of sleep for more.  He states he is feeling much better overall. Depression screen [facility] 2/9 10/11/2018 09/27/2018 08/17/2018 07/04/2018  Decreased Interest 1 3 2 2   Down, Depressed, Hopeless 1 3 1 3   PHQ - 2 Score 2 6 3 5   Altered sleeping 1 2 1 1   Tired, decreased energy 1 1 1 1   Change in appetite 1 2 3 2   Feeling bad or failure about yourself  1 3 2 2   Trouble concentrating 1 1 1 1   Moving slowly or fidgety/restless 1 1 0 1  Suicidal thoughts 1 2 1 2   PHQ-9 Score 9 18 12 15       History reviewed. No pertinent past medical history. Relevant past medical, surgical, family and social history reviewed and updated as indicated. Interim medical history since our last visit reviewed. Allergies and medications reviewed and updated. DATA REVIEWED: CHART IN EPIC  Family History reviewed for pertinent findings.  Review of Systems  Constitutional: Negative.  Negative for appetite change and fatigue.  Eyes: Negative for pain and visual disturbance.  Respiratory: Negative.  Negative for cough, chest tightness, shortness of breath and wheezing.   Cardiovascular: Negative.  Negative for chest pain, palpitations and leg swelling.  Gastrointestinal: Negative.  Negative for abdominal pain, diarrhea, nausea and vomiting.  Genitourinary: Negative.   Skin: Negative.  Negative for color change and rash.  Neurological: Negative.  Negative for weakness, numbness and headaches.  Psychiatric/Behavioral:  Negative.     Allergies as of 10/11/2018   No Known Allergies     Medication List        Accurate as of 10/11/18 11:59 PM. Always use your most recent med list.          BENADRYL 25 mg capsule Generic drug:  diphenhydrAMINE Take 50 mg by mouth 2 (two) times daily as needed. For allergies   citalopram 40 MG tablet Commonly known as:  CELEXA Take 1 tablet (40 mg total) by mouth daily.   ibuprofen 200 MG tablet Commonly known as:  ADVIL,MOTRIN Take 400-600 mg by mouth daily as needed. For pain   meloxicam 7.5 MG tablet Commonly known as:  MOBIC TAKE 1 TABLET BY MOUTH ONCE DAILY   risperiDONE 2 MG tablet Commonly known as:  RISPERDAL Take 1 tablet (2 mg total) by mouth at bedtime.   VITAMIN C PO Take 4 tablets by mouth as needed. Takes when getting sick          Objective:    BP 132/87   Pulse 68   Temp (!) 97 F (36.1 C) (Oral)   Ht 5\' 11"  (1.803 m)   Wt 179 lb (81.2 kg)   BMI 24.97 kg/m   No Known Allergies  Wt Readings from Last 3 Encounters:  10/11/18 179 lb (81.2 kg)  09/27/18 179 lb (81.2 kg)  08/17/18 174 lb (78.9 kg)    Physical Exam  Constitutional: He appears well-developed and well-nourished. No distress.  HENT:  Head: Normocephalic and atraumatic.  Eyes: Pupils are equal, round, and reactive to light. Conjunctivae and EOM are normal.  Cardiovascular: Normal rate, regular rhythm and normal heart sounds.  Pulmonary/Chest: Effort normal and breath sounds normal. No respiratory distress.  Skin: Skin is warm and dry.  Psychiatric: His speech is normal and behavior is normal. His mood appears not anxious. He is not agitated, not aggressive and not hyperactive. He exhibits a depressed mood.  Nursing note and vitals reviewed.       Assessment & Plan:   1. Depression, recurrent (HCC) - risperiDONE (RISPERDAL) 2 MG tablet; Take 1 tablet (2 mg total) by mouth at bedtime.  Dispense: 30 tablet; Refill: 5  2. Mood disorder (HCC) - risperiDONE  (RISPERDAL) 2 MG tablet; Take 1 tablet (2 mg total) by mouth at bedtime.  Dispense: 30 tablet; Refill: 5   Continue all other maintenance medications as listed above.  Follow up plan: No follow-ups on file.  Educational handout given for survey  Remus LofflerAngel S. Giah Fickett PA-C [facility] [address]  [place], [place] [id number] [phone]   10/12/2018, 2:17 PM

## 2018-10-18 ENCOUNTER — Telehealth: Payer: Self-pay | Admitting: Physician Assistant

## 2019-01-13 ENCOUNTER — Ambulatory Visit: Payer: Self-pay | Admitting: Physician Assistant

## 2019-01-13 ENCOUNTER — Encounter: Payer: Self-pay | Admitting: Physician Assistant

## 2019-01-13 DIAGNOSIS — F339 Major depressive disorder, recurrent, unspecified: Secondary | ICD-10-CM

## 2019-01-13 DIAGNOSIS — G5603 Carpal tunnel syndrome, bilateral upper limbs: Secondary | ICD-10-CM

## 2019-01-13 DIAGNOSIS — F39 Unspecified mood [affective] disorder: Secondary | ICD-10-CM

## 2019-01-13 MED ORDER — RISPERIDONE 2 MG PO TABS: 2.0000 mg | ORAL_TABLET | Freq: Every day | ORAL | 5 refills | Status: DC

## 2019-01-13 MED ORDER — MELOXICAM 7.5 MG PO TABS: 7.5000 mg | ORAL_TABLET | Freq: Every day | ORAL | 5 refills | Status: DC

## 2019-01-13 MED ORDER — CITALOPRAM HYDROBROMIDE 40 MG PO TABS: 40.0000 mg | ORAL_TABLET | Freq: Every day | ORAL | 5 refills | Status: DC

## 2019-01-13 NOTE — Progress Notes (Signed)
BP 121/74   Pulse 75   Temp (!) 96.8 F (36 C) (Oral)   Ht 5\' 11"  (1.803 m)   Wt 221 lb 9.6 oz (100.5 kg)   BMI 30.91 kg/m    Subjective:    Patient ID: Connor Crawford, male    DOB: 1986/03/16, 33 y.o.   MRN: [id number]  HPI: Connor Crawford is a 33 y.o. male presenting on 01/13/2019 for Anxiety and Depression  This patient comes in for periodic recheck on medications and conditions including depression and mood disorder.  He reports that he is feeling better.    Depression screen [facility] 2/9 01/13/2019 10/11/2018 09/27/2018 08/17/2018 07/04/2018  Decreased Interest 1 1 3 2 2   Down, Depressed, Hopeless 1 1 3 1 3   PHQ - 2 Score 2 2 6 3 5   Altered sleeping 0 1 2 1 1   Tired, decreased energy 1 1 1 1 1   Change in appetite 1 1 2 3 2   Feeling bad or failure about yourself  1 1 3 2 2   Trouble concentrating 0 1 1 1 1   Moving slowly or fidgety/restless 0 1 1 0 1  Suicidal thoughts 0 1 2 1 2   PHQ-9 Score 5 9 18 12 15      All medications are reviewed today. There are no reports of any problems with the medications. All of the medical conditions are reviewed and updated.  Lab work is reviewed and will be ordered as medically necessary. There are no new problems reported with today's visit.  History reviewed. No pertinent past medical history. Relevant past medical, surgical, family and social history reviewed and updated as indicated. Interim medical history since our last visit reviewed. Allergies and medications reviewed and updated. DATA REVIEWED: CHART IN EPIC  Family History reviewed for pertinent findings.  Review of Systems  Constitutional: Negative.  Negative for appetite change and fatigue.  Eyes: Negative for pain and visual disturbance.  Respiratory: Negative.  Negative for cough, chest tightness, shortness of breath and wheezing.   Cardiovascular: Negative.  Negative for chest pain, palpitations and leg swelling.  Gastrointestinal: Negative.  Negative for abdominal pain,  diarrhea, nausea and vomiting.  Genitourinary: Negative.   Skin: Negative.  Negative for color change and rash.  Neurological: Negative.  Negative for weakness, numbness and headaches.  Psychiatric/Behavioral: Negative.     Allergies as of 01/13/2019   No Known Allergies     Medication List       Accurate as of January 13, 2019  9:29 AM. Always use your most recent med list.        BENADRYL 25 mg capsule Generic drug:  diphenhydrAMINE Take 50 mg by mouth 2 (two) times daily as needed. For allergies   citalopram 40 MG tablet Commonly known as:  CELEXA Take 1 tablet (40 mg total) by mouth daily.   ibuprofen 200 MG tablet Commonly known as:  ADVIL,MOTRIN Take 400-600 mg by mouth daily as needed. For pain   meloxicam 7.5 MG tablet Commonly known as:  MOBIC Take 1 tablet (7.5 mg total) by mouth daily.   risperiDONE 2 MG tablet Commonly known as:  RISPERDAL Take 1 tablet (2 mg total) by mouth at bedtime.   VITAMIN C PO Take 4 tablets by mouth as needed. Takes when getting sick          Objective:    BP 121/74   Pulse 75   Temp (!) 96.8 F (36 C) (Oral)   Ht 5\' 11"  (  1.803 m)   Wt 221 lb 9.6 oz (100.5 kg)   BMI 30.91 kg/m   No Known Allergies  Wt Readings from Last 3 Encounters:  01/13/19 221 lb 9.6 oz (100.5 kg)  10/11/18 179 lb (81.2 kg)  09/27/18 179 lb (81.2 kg)    Physical Exam Constitutional:      Appearance: He is well-developed.  HENT:     Head: Normocephalic and atraumatic.     Right Ear: Hearing and tympanic membrane normal.     Left Ear: Hearing and tympanic membrane normal.     Nose: Mucosal edema present. No nasal deformity.     Right Sinus: Frontal sinus tenderness present.     Left Sinus: Frontal sinus tenderness present.     Mouth/Throat:     Pharynx: Posterior oropharyngeal erythema present.  Eyes:     General:        Right eye: No discharge.        Left eye: No discharge.     Conjunctiva/sclera: Conjunctivae normal.     Pupils:  Pupils are equal, round, and reactive to light.  Neck:     Musculoskeletal: Normal range of motion and neck supple.  Cardiovascular:     Rate and Rhythm: Normal rate and regular rhythm.     Heart sounds: Normal heart sounds.  Pulmonary:     Effort: Pulmonary effort is normal. No respiratory distress.     Breath sounds: Wheezing present. No decreased breath sounds, rhonchi or rales.  Abdominal:     General: Bowel sounds are normal.     Palpations: Abdomen is soft.  Musculoskeletal: Normal range of motion.  Skin:    General: Skin is warm and dry.         Assessment & Plan:   1. Depression, recurrent (HCC) - citalopram (CELEXA) 40 MG tablet; Take 1 tablet (40 mg total) by mouth daily.  Dispense: 30 tablet; Refill: 5 - risperiDONE (RISPERDAL) 2 MG tablet; Take 1 tablet (2 mg total) by mouth at bedtime.  Dispense: 30 tablet; Refill: 5  2. Mood disorder (HCC) - risperiDONE (RISPERDAL) 2 MG tablet; Take 1 tablet (2 mg total) by mouth at bedtime.  Dispense: 30 tablet; Refill: 5  3. Carpal tunnel syndrome on both sides - meloxicam (MOBIC) 7.5 MG tablet; Take 1 tablet (7.5 mg total) by mouth daily.  Dispense: 30 tablet; Refill: 5   Continue all other maintenance medications as listed above.  Follow up plan: No follow-ups on file.  Educational handout given for survey  Remus Loffler PA-C [facility] [address]  [place], [place] [address] [phone]   01/13/2019, 9:29 AM

## 2019-03-15 ENCOUNTER — Other Ambulatory Visit: Payer: Self-pay | Admitting: Physician Assistant

## 2019-03-15 DIAGNOSIS — F339 Major depressive disorder, recurrent, unspecified: Secondary | ICD-10-CM

## 2019-03-15 DIAGNOSIS — F419 Anxiety disorder, unspecified: Secondary | ICD-10-CM

## 2019-03-20 ENCOUNTER — Telehealth: Payer: Self-pay | Admitting: Physician Assistant

## 2019-03-20 NOTE — Telephone Encounter (Signed)
What is the name of the medication? citalopram (CELEXA) 40 MG tablet  Have you contacted your pharmacy to request a refill? Yes he was told he has no refills but was refilled in February with 5 refills  Which pharmacy would you like this sent to? [facility] [place]  Patient notified that their request is being sent to the clinical staff for review and that they should receive a call once it is complete. If they do not receive a call within 24 hours they can check with their pharmacy or our office.

## 2019-03-21 ENCOUNTER — Other Ambulatory Visit: Payer: Self-pay | Admitting: Physician Assistant

## 2019-03-21 DIAGNOSIS — F339 Major depressive disorder, recurrent, unspecified: Secondary | ICD-10-CM

## 2019-03-21 DIAGNOSIS — F419 Anxiety disorder, unspecified: Secondary | ICD-10-CM

## 2019-03-21 NOTE — Telephone Encounter (Signed)
Pt was calling pharmacy using an old Rx bottle for 20 mg tablets, will call back asking for refill by name & 40 mg which is the strength the patients has been on since September.

## 2019-07-14 ENCOUNTER — Telehealth: Payer: Self-pay | Admitting: Physician Assistant

## 2019-07-14 NOTE — Telephone Encounter (Signed)
Pt cleared to come in to office

## 2019-07-17 ENCOUNTER — Ambulatory Visit (INDEPENDENT_AMBULATORY_CARE_PROVIDER_SITE_OTHER): Payer: Self-pay | Admitting: Physician Assistant

## 2019-07-17 ENCOUNTER — Other Ambulatory Visit: Payer: Self-pay

## 2019-07-17 ENCOUNTER — Encounter: Payer: Self-pay | Admitting: Physician Assistant

## 2019-07-17 DIAGNOSIS — F339 Major depressive disorder, recurrent, unspecified: Secondary | ICD-10-CM

## 2019-07-17 DIAGNOSIS — F39 Unspecified mood [affective] disorder: Secondary | ICD-10-CM

## 2019-07-17 MED ORDER — RISPERIDONE 2 MG PO TABS: 2.0000 mg | ORAL_TABLET | Freq: Every day | ORAL | 5 refills | Status: AC

## 2019-07-17 MED ORDER — BUPROPION HCL ER (XL) 150 MG PO TB24: 150.0000 mg | ORAL_TABLET | Freq: Every day | ORAL | 5 refills | Status: AC

## 2019-07-17 MED ORDER — CITALOPRAM HYDROBROMIDE 40 MG PO TABS: 40.0000 mg | ORAL_TABLET | Freq: Every day | ORAL | 5 refills | Status: AC

## 2019-07-17 NOTE — Progress Notes (Signed)
BP 130/89   Pulse 76   Temp 97.8 F (36.6 C) (Oral)   Ht 5\' 11"  (1.803 m)   Wt 250 lb (113.4 kg)   BMI 34.87 kg/m    Subjective:    Patient ID: Connor Crawford, male    DOB: 10-10-1986, 33 y.o.   MRN: [id number]  HPI: Connor KnowsJesse D Sturgill is a 33 y.o. male presenting on 07/17/2019 for Medical Management of Chronic Issues  This patient comes in for recheck on his depression.  He states that overall he is feeling better as far as depression goes.  However he still has some lack of motivation and energy.  He has never taken Wellbutrin in the past.  We have discussed that this medicine works on dopamine and may help those symptoms that he just described.  We will send the prescription in and ask him to take it in the morning time.  We will recheck him in 4 weeks.  [facility] 07/17/2019 01/13/2019 10/11/2018 09/27/2018 08/17/2018  Decreased Interest 1 1 1 3 2   Down, Depressed, Hopeless 0 1 1 3 1   PHQ - 2 Score 1 2 2 6 3   Altered sleeping 0 0 1 2 1   Tired, decreased energy 2 1 1 1 1   Change in appetite 0 1 1 2 3   Feeling bad or failure about yourself  0 1 1 3 2   Trouble concentrating 1 0 1 1 1   Moving slowly or fidgety/restless 0 0 1 1 0  Suicidal thoughts 0 0 1 2 1   PHQ-9 Score 4 5 9 18 12     Past Medical History:  Diagnosis Date  . Depression    Relevant past medical, surgical, family and social history reviewed and updated as indicated. Interim medical history since our last visit reviewed. Allergies and medications reviewed and updated. DATA REVIEWED: CHART IN EPIC  Family History reviewed for pertinent findings.  Review of Systems  Constitutional: Negative.  Negative for appetite change and fatigue.  Eyes: Negative for pain and visual disturbance.  Respiratory: Negative.  Negative for cough, chest tightness, shortness of breath and wheezing.   Cardiovascular: Negative.  Negative for chest pain, palpitations and leg swelling.  Gastrointestinal: Negative.  Negative  for abdominal pain, diarrhea, nausea and vomiting.  Genitourinary: Negative.   Skin: Negative.  Negative for color change and rash.  Neurological: Negative.  Negative for weakness, numbness and headaches.  Psychiatric/Behavioral: Negative.     Allergies as of 07/17/2019   No Known Allergies     Medication List       Accurate as of July 17, 2019  8:22 AM. If you have any questions, ask your nurse or doctor.        STOP taking these medications   Benadryl 25 mg capsule Generic drug: diphenhydrAMINE Stopped by: Remus LofflerAngel S Jaymar Loeber, PA-C   VITAMIN C PO Stopped by: Remus LofflerAngel S Manette Doto, PA-C     TAKE these medications   buPROPion 150 MG 24 hr tablet Commonly known as: Wellbutrin XL Take 1 tablet (150 mg total) by mouth daily. Started by: Remus LofflerAngel S Zykiria Bruening, PA-C   citalopram 40 MG tablet Commonly known as: CeleXA Take 1 tablet (40 mg total) by mouth daily.   ibuprofen 200 MG tablet Commonly known as: ADVIL Take 400-600 mg by mouth daily as needed. For pain   meloxicam 7.5 MG tablet Commonly known as: MOBIC Take 1 tablet (7.5 mg total) by mouth daily.   risperiDONE 2 MG tablet Commonly  known as: RISPERDAL Take 1 tablet (2 mg total) by mouth at bedtime.          Objective:    BP 130/89   Pulse 76   Temp 97.8 F (36.6 C) (Oral)   Ht 5\' 11"  (1.803 m)   Wt 250 lb (113.4 kg)   BMI 34.87 kg/m   No Known Allergies  Wt Readings from Last 3 Encounters:  07/17/19 250 lb (113.4 kg)  01/13/19 221 lb 9.6 oz (100.5 kg)  10/11/18 179 lb (81.2 kg)    Physical Exam Vitals signs and nursing note reviewed.  Constitutional:      General: He is not in acute distress.    Appearance: He is well-developed.  HENT:     Head: Normocephalic and atraumatic.  Eyes:     Conjunctiva/sclera: Conjunctivae normal.     Pupils: Pupils are equal, round, and reactive to light.  Cardiovascular:     Rate and Rhythm: Normal rate and regular rhythm.     Heart sounds: Normal heart sounds.  Pulmonary:      Effort: Pulmonary effort is normal. No respiratory distress.     Breath sounds: Normal breath sounds.  Skin:    General: Skin is warm and dry.  Psychiatric:        Behavior: Behavior normal.         Assessment & Plan:   1. Depression, recurrent (HCC) - buPROPion (WELLBUTRIN XL) 150 MG 24 hr tablet; Take 1 tablet (150 mg total) by mouth daily.  Dispense: 30 tablet; Refill: 5 - citalopram (CELEXA) 40 MG tablet; Take 1 tablet (40 mg total) by mouth daily.  Dispense: 30 tablet; Refill: 5 - risperiDONE (RISPERDAL) 2 MG tablet; Take 1 tablet (2 mg total) by mouth at bedtime.  Dispense: 30 tablet; Refill: 5  2. Mood disorder (HCC) - risperiDONE (RISPERDAL) 2 MG tablet; Take 1 tablet (2 mg total) by mouth at bedtime.  Dispense: 30 tablet; Refill: 5   Continue all other maintenance medications as listed above.  Follow up plan: Return in about 4 weeks (around 08/14/2019) for recheck medications.  Educational handout given for [place] PA-C [place] [address]  [place], [place] [address] [phone]   07/17/2019, 8:22 AM

## 2019-08-14 ENCOUNTER — Other Ambulatory Visit: Payer: Self-pay

## 2019-08-15 ENCOUNTER — Ambulatory Visit (INDEPENDENT_AMBULATORY_CARE_PROVIDER_SITE_OTHER): Payer: Self-pay | Admitting: Physician Assistant

## 2019-08-15 ENCOUNTER — Encounter: Payer: Self-pay | Admitting: Physician Assistant

## 2019-08-15 VITALS — BP 135/89 | HR 73 | Temp 95.5°F | Ht 71.0 in | Wt 251.0 lb

## 2019-08-15 DIAGNOSIS — H9202 Otalgia, left ear: Secondary | ICD-10-CM

## 2019-08-15 DIAGNOSIS — G5603 Carpal tunnel syndrome, bilateral upper limbs: Secondary | ICD-10-CM

## 2019-08-15 DIAGNOSIS — F339 Major depressive disorder, recurrent, unspecified: Secondary | ICD-10-CM

## 2019-08-15 MED ORDER — NEOMYCIN-POLYMYXIN-HC 3.5-10000-1 OT SOLN: 3.0000 [drp] | Freq: Four times a day (QID) | OTIC | 0 refills | Status: DC

## 2019-08-15 MED ORDER — MELOXICAM 7.5 MG PO TABS: 7.5000 mg | ORAL_TABLET | Freq: Every day | ORAL | 11 refills | Status: AC

## 2019-08-15 NOTE — Progress Notes (Signed)
BP 135/89   Pulse 73   Temp (!) 95.5 F (35.3 C) (Temporal)   Ht 5\' 11"  (1.803 m)   Wt 251 lb (113.9 kg)   BMI 35.01 kg/m    Subjective:    Patient ID: Connor Crawford, male    DOB: Jun 09, 1986, 33 y.o.   MRN: [id number]  HPI: Connor Crawford is a 33 y.o. male presenting on 08/15/2019 for Depression (4 week follow up ) and Ear Pain (left)   Patient reports he is having some ear pain.  He does not have any history of being in a pool or anything but he has had a little bit of drainage and it is painful.  He denies any fever or chills.  He is also here for recheck on her depression.  He is doing very well he states.  He is at home.  He feels that the medicine is doing a very good job.  Refills are needed today.  Dep[facility] screen [facility] 08/15/2019 07/17/2019 01/13/2019 10/11/2018 09/27/2018  Decreased Interest 1 1 1 1 3   Down, Depressed, Hopeless 0 0 1 1 3   PHQ - 2 Score 1 1 2 2 6   Altered sleeping 1 0 0 1 2  Tired, decreased energy 1 2 1 1 1   Change in appetite 0 0 1 1 2   Feeling bad or failure about yourself  0 0 1 1 3   Trouble concentrating 0 1 0 1 1  Moving slowly or fidgety/restless 0 0 0 1 1  Suicidal thoughts 0 0 0 1 2  PHQ-9 Score 3 4 5 9 18     Past Medical History:  Diagnosis Date  . Depression    Relevant past medical, surgical, family and social history reviewed and updated as indicated. Interim medical history since our last visit reviewed. Allergies and medications reviewed and updated. DATA REVIEWED: CHART IN EPIC  Family History reviewed for pertinent findings.  Review of Systems  Constitutional: Negative.  Negative for appetite change and fatigue.  Eyes: Negative for pain and visual disturbance.  Respiratory: Negative.  Negative for cough, chest tightness, shortness of breath and wheezing.   Cardiovascular: Negative.  Negative for chest pain, palpitations and leg swelling.  Gastrointestinal: Negative.  Negative for abdominal pain, diarrhea, nausea and  vomiting.  Genitourinary: Negative.   Skin: Negative.  Negative for color change and rash.  Neurological: Negative.  Negative for weakness, numbness and headaches.  Psychiatric/Behavioral: Negative.     Allergies as of 08/15/2019   No Known Allergies     Medication List       Accurate as of August 15, 2019 11:59 PM. If you have any questions, ask your nurse or doctor.        buPROPion 150 MG 24 hr tablet Commonly known as: Wellbutrin XL Take 1 tablet (150 mg total) by mouth daily.   citalopram 40 MG tablet Commonly known as: CeleXA Take 1 tablet (40 mg total) by mouth daily.   ibuprofen 200 MG tablet Commonly known as: ADVIL Take 400-600 mg by mouth daily as needed. For pain   meloxicam 7.5 MG tablet Commonly known as: MOBIC Take 1 tablet (7.5 mg total) by mouth daily.   neomycin-polymyxin-hydrocortisone OTIC solution Commonly known as: CORTISPORIN Place 3 drops into the left ear 4 (four) times daily. Started by: Remus LofflerAngel S Brailee Riede, PA-C   risperiDONE 2 MG tablet Commonly known as: RISPERDAL Take 1 tablet (2 mg total) by mouth at bedtime.  Objective:    BP 135/89   Pulse 73   Temp (!) 95.5 F (35.3 C) (Temporal)   Ht 5\' 11"  (1.803 m)   Wt 251 lb (113.9 kg)   BMI 35.01 kg/m   No Known Allergies  Wt Readings from Last 3 Encounters:  08/15/19 251 lb (113.9 kg)  07/17/19 250 lb (113.4 kg)  01/13/19 221 lb 9.6 oz (100.5 kg)    Physical Exam Vitals signs and nursing note reviewed.  Constitutional:      General: He is not in acute distress.    Appearance: He is well-developed.  HENT:     Head: Normocephalic and atraumatic.  Eyes:     Conjunctiva/sclera: Conjunctivae normal.     Pupils: Pupils are equal, round, and reactive to light.  Cardiovascular:     Rate and Rhythm: Normal rate and regular rhythm.     Heart sounds: Normal heart sounds.  Pulmonary:     Effort: Pulmonary effort is normal. No respiratory distress.     Breath sounds: Normal  breath sounds.  Skin:    General: Skin is warm and dry.  Psychiatric:        Behavior: Behavior normal.         Assessment & Plan:   1. Carpal tunnel syndrome on both sides - meloxicam (MOBIC) 7.5 MG tablet; Take 1 tablet (7.5 mg total) by mouth daily.  Dispense: 30 tablet; Refill: 11  2. Left ear pain - neomycin-polymyxin-hydrocortisone (CORTISPORIN) OTIC solution; Place 3 drops into the left ear 4 (four) times daily.  Dispense: 10 mL; Refill: 0  3. Depression, recurrent (HCC) Continue Celexa 40 mg 1 daily Wellbutrin 150 mg 1 daily Risperidone 2 mg once at bed    Continue all other maintenance medications as listed above.  Follow up plan: No follow-ups on file.  Educational handout given for [place] PA-C [place] [address]  [place], [place] [address] [phone]   08/17/2019, 7:48 PM

## 2019-08-17 ENCOUNTER — Encounter: Payer: Self-pay | Admitting: Physician Assistant

## 2019-10-13 ENCOUNTER — Other Ambulatory Visit: Payer: Self-pay

## 2019-10-13 ENCOUNTER — Encounter: Payer: Self-pay | Admitting: Family Medicine

## 2019-10-13 ENCOUNTER — Ambulatory Visit (INDEPENDENT_AMBULATORY_CARE_PROVIDER_SITE_OTHER): Payer: Self-pay | Admitting: Family Medicine

## 2019-10-13 VITALS — BP 116/71 | HR 67 | Temp 98.6°F | Resp 20 | Ht 71.0 in | Wt 269.0 lb

## 2019-10-13 DIAGNOSIS — M940 Chondrocostal junction syndrome [Tietze]: Secondary | ICD-10-CM

## 2019-10-13 DIAGNOSIS — R0781 Pleurodynia: Secondary | ICD-10-CM

## 2019-10-13 MED ORDER — PREDNISONE 20 MG PO TABS: ORAL_TABLET | ORAL | 0 refills | Status: DC

## 2019-10-13 NOTE — Patient Instructions (Signed)
Costochondritis Costochondritis is swelling and irritation (inflammation) of the tissue (cartilage) that connects your ribs to your breastbone (sternum). This causes pain in the front of your chest. Usually, the pain:  Starts gradually.  Is in more than one rib. This condition usually goes away on its own over time. Follow these instructions at home:  Do not do anything that makes your pain worse.  If directed, put ice on the painful area: ? Put ice in a plastic bag. ? Place a towel between your skin and the bag. ? Leave the ice on for 20 minutes, 2-3 times a day.  If directed, put heat on the affected area as often as told by your doctor. Use the heat source that your doctor tells you to use, such as a moist heat pack or a heating pad. ? Place a towel between your skin and the heat source. ? Leave the heat on for 20-30 minutes. ? Take off the heat if your skin turns bright red. This is very important if you cannot feel pain, heat, or cold. You may have a greater risk of getting burned.  Take over-the-counter and prescription medicines only as told by your doctor.  Return to your normal activities as told by your doctor. Ask your doctor what activities are safe for you.  Keep all follow-up visits as told by your doctor. This is important. Contact a doctor if:  You have chills or a fever.  Your pain does not go away or it gets worse.  You have a cough that does not go away. Get help right away if:  You are short of breath. This information is not intended to replace advice given to you by your health care provider. Make sure you discuss any questions you have with your health care provider. Document Released: 05/04/2008 Document Revised: 12/01/2017 Document Reviewed: 03/11/2016 Elsevier Patient Education  2020 Elsevier Inc[employer]  

## 2019-10-14 NOTE — Progress Notes (Signed)
Subjective:  Patient ID: Connor Crawford, male    DOB: [birth year], 33 y.o.   MRN: [id number]  Patient Care Team: Theodoro Clock as PCP - General (Physician Assistant)   Chief Complaint:  right rib pain (x 1 week )   HPI: Connor Crawford is a 33 y.o. male presenting on 10/13/2019 for right rib pain (x 1 week )   Pt presents today with complaints of right sided rib pain. Pt states the pain started about a week ago. States he has been coughing a lot and that the pain is worse with coughing and deep breathing. He denies fever, chills, shortness of breath, palpitations, wheezing, or congestion. Pt denies injury. States he has not taken anything for the pain.     Relevant past medical, surgical, family, and social history reviewed and updated as indicated.  Allergies and medications reviewed and updated. Date reviewed: Chart in Epic.   Past Medical History:  Diagnosis Date  . Depression     Past Surgical History:  Procedure Laterality Date  . APPENDECTOMY    . WISDOM TOOTH EXTRACTION      Social History   Socioeconomic History  . Marital status: Single    Spouse name: Not on file  . Number of children: Not on file  . Years of education: Not on file  . Highest education level: Not on file  Occupational History  . Not on file  Social Needs  . Financial resource strain: Not on file  . Food insecurity    Worry: Not on file    Inability: Not on file  . Transportation needs    Medical: Not on file    Non-medical: Not on file  Tobacco Use  . Smoking status: Current Every Day Smoker    Packs/day: 2.00    Years: 15.00    Pack years: 30.00    Types: Cigarettes  . Smokeless tobacco: Never Used  Substance and Sexual Activity  . Alcohol use: Yes    Alcohol/week: 12.0 standard drinks    Types: 12 Cans of beer per week  . Drug use: Yes    Types: Marijuana    Comment: Pain Pills   . Sexual activity: Not Currently  Lifestyle  . Physical activity    Days per week:  Not on file    Minutes per session: Not on file  . Stress: Not on file  Relationships  . Social Herbalist on phone: Not on file    Gets together: Not on file    Attends religious service: Not on file    Active member of club or organization: Not on file    Attends meetings of clubs or organizations: Not on file    Relationship status: Not on file  . Intimate partner violence    Fear of current or ex partner: Not on file    Emotionally abused: Not on file    Physically abused: Not on file    Forced sexual activity: Not on file  Other Topics Concern  . Not on file  Social History Narrative  . Not on file    Outpatient Encounter Medications as of 10/13/2019  Medication Sig  . buPROPion (WELLBUTRIN XL) 150 MG 24 hr tablet Take 1 tablet (150 mg total) by mouth daily.  . citalopram (CELEXA) 40 MG tablet Take 1 tablet (40 mg total) by mouth daily.  Marland Kitchen ibuprofen (ADVIL,MOTRIN) 200 MG tablet Take 400-600 mg by mouth daily as  needed. For pain   . meloxicam (MOBIC) 7.5 MG tablet Take 1 tablet (7.5 mg total) by mouth daily.  . risperiDONE (RISPERDAL) 2 MG tablet Take 1 tablet (2 mg total) by mouth at bedtime.  . predniSONE (DELTASONE) 20 MG tablet 2 po at sametime daily for 5 days  . [DISCONTINUED] neomycin-polymyxin-hydrocortisone (CORTISPORIN) OTIC solution Place 3 drops into the left ear 4 (four) times daily.   No facility-administered encounter medications on file as of 10/13/2019.     No Known Allergies  Review of Systems  Constitutional: Negative for activity change, appetite change, chills, diaphoresis, fatigue, fever and unexpected weight change.  HENT: Negative.   Eyes: Negative.   Respiratory: Positive for cough. Negative for chest tightness and shortness of breath.   Cardiovascular: Negative for chest pain, palpitations and leg swelling.  Gastrointestinal: Negative for abdominal pain, blood in stool, constipation, diarrhea, nausea and vomiting.  Endocrine:  Negative.   Genitourinary: Negative for decreased urine volume, difficulty urinating, dysuria, frequency and urgency.  Musculoskeletal: Positive for arthralgias (right ribs).  Skin: Negative.   Allergic/Immunologic: Negative.   Neurological: Negative for dizziness, syncope, weakness, light-headedness and headaches.  Hematological: Negative.   Psychiatric/Behavioral: Negative for confusion, hallucinations, sleep disturbance and suicidal ideas.  All other systems reviewed and are negative.       Objective:  BP 116/71   Pulse 67   Temp 98.6 F (37 C)   Resp 20   Ht 5\' 11"  (1.803 m)   Wt 269 lb (122 kg)   SpO2 96%   BMI 37.52 kg/m    Wt Readings from Last 3 Encounters:  10/13/19 269 lb (122 kg)  08/15/19 251 lb (113.9 kg)  07/17/19 250 lb (113.4 kg)    Physical Exam Vitals signs and nursing note reviewed.  Constitutional:      General: He is not in acute distress.    Appearance: Normal appearance. He is well-developed and well-groomed. He is obese. He is not ill-appearing, toxic-appearing or diaphoretic.  HENT:     Head: Normocephalic and atraumatic.     Jaw: There is normal jaw occlusion.     Right Ear: Hearing, tympanic membrane, ear canal and external ear normal.     Left Ear: Hearing, tympanic membrane, ear canal and external ear normal.     Nose: Nose normal. No congestion or rhinorrhea.     Mouth/Throat:     Lips: Pink.     Mouth: Mucous membranes are moist.     Pharynx: Oropharynx is clear. Uvula midline. No oropharyngeal exudate or posterior oropharyngeal erythema.  Eyes:     General: Lids are normal.     Extraocular Movements: Extraocular movements intact.     Conjunctiva/sclera: Conjunctivae normal.     Pupils: Pupils are equal, round, and reactive to light.  Neck:     Musculoskeletal: Normal range of motion and neck supple.     Thyroid: No thyroid mass, thyromegaly or thyroid tenderness.     Vascular: No carotid bruit or JVD.     Trachea: Trachea and  phonation normal.  Cardiovascular:     Rate and Rhythm: Normal rate and regular rhythm.     Chest Wall: PMI is not displaced.     Pulses: Normal pulses.     Heart sounds: Normal heart sounds. No murmur. No friction rub. No gallop.   Pulmonary:     Effort: Pulmonary effort is normal. No respiratory distress.     Breath sounds: Normal breath sounds. No wheezing.  Chest:  Chest wall: Tenderness present. No mass, lacerations, deformity, swelling, crepitus or edema. There is no dullness to percussion.    Abdominal:     General: Bowel sounds are normal. There is no distension or abdominal bruit.     Palpations: Abdomen is soft. There is no hepatomegaly or splenomegaly.     Tenderness: There is no abdominal tenderness. There is no right CVA tenderness or left CVA tenderness.     Hernia: No hernia is present.  Musculoskeletal: Normal range of motion.     Right lower leg: No edema.     Left lower leg: No edema.  Lymphadenopathy:     Cervical: No cervical adenopathy.  Skin:    General: Skin is warm and dry.     Capillary Refill: Capillary refill takes less than 2 seconds.     Coloration: Skin is not cyanotic, jaundiced or pale.     Findings: No rash.  Neurological:     General: No focal deficit present.     Mental Status: He is alert and oriented to person, place, and time.     Cranial Nerves: Cranial nerves are intact.     Sensory: Sensation is intact.     Motor: Motor function is intact.     Coordination: Coordination is intact.     Gait: Gait is intact.     Deep Tendon Reflexes: Reflexes are normal and symmetric.  Psychiatric:        Attention and Perception: Attention and perception normal.        Mood and Affect: Mood and affect normal.        Speech: Speech normal.        Behavior: Behavior normal. Behavior is cooperative.        Thought Content: Thought content normal.        Cognition and Memory: Cognition and memory normal.        Judgment: Judgment normal.      Results for orders placed or performed during the hospital encounter of 07/28/11  Urinalysis with microscopic  Result Value Ref Range   Color, Urine YELLOW YELLOW   APPearance CLEAR CLEAR   Specific Gravity, Urine 1.030 1.005 - 1.030   pH 6.0 5.0 - 8.0   Glucose, UA NEGATIVE NEGATIVE mg/dL   Hgb urine dipstick TRACE (A) NEGATIVE   Bilirubin Urine NEGATIVE NEGATIVE   Ketones, ur NEGATIVE NEGATIVE mg/dL   Protein, ur NEGATIVE NEGATIVE mg/dL   Urobilinogen, UA 0.2 0.0 - 1.0 mg/dL   Nitrite NEGATIVE NEGATIVE   Leukocytes, UA NEGATIVE NEGATIVE  Urine microscopic-add on  Result Value Ref Range   RBC / HPF 0-2 <3 RBC/hpf  CBC  Result Value Ref Range   WBC 8.1 4.0 - 10.5 K/uL   RBC 5.18 4.22 - 5.81 MIL/uL   Hemoglobin 16.3 13.0 - 17.0 g/dL   HCT 16.1 09.6 - 04.5 %   MCV 89.8 78.0 - 100.0 fL   MCH 31.5 26.0 - 34.0 pg   MCHC 35.1 30.0 - 36.0 g/dL   RDW 40.9 81.1 - 91.4 %   Platelets 227 150 - 400 K/uL  Differential  Result Value Ref Range   Neutrophils Relative % 66 43 - 77 %   Neutro Abs 5.4 1.7 - 7.7 K/uL   Lymphocytes Relative 18 12 - 46 %   Lymphs Abs 1.5 0.7 - 4.0 K/uL   Monocytes Relative 14 (H) 3 - 12 %   Monocytes Absolute 1.2 (H) 0.1 - 1.0 K/uL   Eosinophils  Relative 2 0 - 5 %   Eosinophils Absolute 0.1 0.0 - 0.7 K/uL   Basophils Relative 0 0 - 1 %   Basophils Absolute 0.0 0.0 - 0.1 K/uL  Comprehensive metabolic panel  Result Value Ref Range   Sodium 135 135 - 145 mEq/L   Potassium 3.8 3.5 - 5.1 mEq/L   Chloride 99 96 - 112 mEq/L   CO2 26 19 - 32 mEq/L   Glucose, Bld 88 70 - 99 mg/dL   BUN 7 6 - 23 mg/dL   Creatinine, Ser 4.090.87 0.50 - 1.35 mg/dL   Calcium 9.7 8.4 - 81.110.5 mg/dL   Total Protein 7.7 6.0 - 8.3 g/dL   Albumin 4.1 3.5 - 5.2 g/dL   AST 32 0 - 37 U/L   ALT 34 0 - 53 U/L   Alkaline Phosphatase 85 39 - 117 U/L   Total Bilirubin 0.4 0.3 - 1.2 mg/dL   GFR calc non Af Amer >60 >60 mL/min   GFR calc Af Amer >60 >60 mL/min       Pertinent labs & imaging  results that were available during my care of the patient were reviewed by me and considered in my medical decision making.  Assessment & Plan:  Connor Crawford was seen today for right rib pain.  Diagnoses and all orders for this visit:  Rib pain on right side Costochondritis Right sided chest / rib pain that is worse with deep breathing and movement after a week of coughing. No chest wall crepitus or swelling. Lungs sounds clear to auscultation bilaterally. Right sided costochondritis. Symptomatic care discussed in detail. Will initiate below. Report any new or worsening symptoms.  -     predniSONE (DELTASONE) 20 MG tablet; 2 po at sametime daily for 5 days     Continue all other maintenance medications.  Follow up plan: Return if symptoms worsen or fail to improve.  Continue healthy lifestyle choices, including diet (rich in fruits, vegetables, and lean proteins, and low in salt and simple carbohydrates) and exercise (at least 30 minutes of moderate physical activity daily).  Educational handout given for costochondritis  The above assessment and management plan was discussed with the patient. The patient verbalized understanding of and has agreed to the management plan. Patient is aware to call the clinic if they develop any new symptoms or if symptoms persist or worsen. Patient is aware when to return to the clinic for a follow-up visit. Patient educated on when it is appropriate to go to the emergency department.   Kari BaarsMichelle Tomeika Weinmann, FNP-C [facility] [phone]

## 2019-10-30 ENCOUNTER — Ambulatory Visit (INDEPENDENT_AMBULATORY_CARE_PROVIDER_SITE_OTHER): Payer: Self-pay | Admitting: Physician Assistant

## 2019-10-30 ENCOUNTER — Other Ambulatory Visit: Payer: Self-pay

## 2019-10-30 ENCOUNTER — Encounter: Payer: Self-pay | Admitting: Physician Assistant

## 2019-10-30 VITALS — BP 112/69 | HR 72 | Temp 97.3°F | Ht 71.0 in | Wt 260.0 lb

## 2019-10-30 DIAGNOSIS — R0781 Pleurodynia: Secondary | ICD-10-CM

## 2019-10-30 MED ORDER — PREDNISONE 10 MG (48) PO TBPK
ORAL_TABLET | ORAL | 0 refills | Status: AC
Start: 2019-10-30 — End: ?

## 2019-10-30 MED ORDER — CYCLOBENZAPRINE HCL 10 MG PO TABS: 10.0000 mg | ORAL_TABLET | Freq: Three times a day (TID) | ORAL | 0 refills | Status: DC | PRN

## 2019-10-30 NOTE — Progress Notes (Signed)
BP 112/69   Pulse 72   Temp (!) 97.3 F (36.3 C) (Temporal)   Ht 5\' 11"  (1.803 m)   Wt 260 lb (117.9 kg)   SpO2 96%   BMI 36.26 kg/m    Subjective:    Patient ID: Connor Crawford, male    DOB: 03-26-1986, 33 y.o.   MRN: [id number]  HPI: TAHMID Crawford is a 33 y.o. male presenting on 10/30/2019 for No chief complaint on file.  About 3 weeks ago the patient felt a sharp pain in his right rib slightly anterolateral around the rib 5 6.  He did not have a specific injury.  Has continued to be painful whenever he takes deep breaths or has to lift or stretch.  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  Recently one of his dogs jumped on his chest and it hurts significantly.  He would like to not have an x-ray at this time for financial reasons.  Discussed the long-term healing of a rib or chest wall whenever it is injured that can be 4 to 6 weeks.  We have also reiterated deep breathing exercises and avoiding lifting.  Past Medical History:  Diagnosis Date  . Depression    Relevant past medical, surgical, family and social history reviewed and updated as indicated. Interim medical history since our last visit reviewed. Allergies and medications reviewed and updated. DATA REVIEWED: CHART IN EPIC  Family History reviewed for pertinent findings.  Review of Systems  Constitutional: Negative.  Negative for appetite change and fatigue.  Eyes: Negative for pain and visual disturbance.  Respiratory: Negative.  Negative for cough, chest tightness, shortness of breath and wheezing.   Cardiovascular: Positive for chest pain. Negative for palpitations and leg swelling.  Gastrointestinal: Negative.  Negative for abdominal pain, diarrhea, nausea and vomiting.  Genitourinary: Negative.   Skin: Negative.  Negative for color change and rash.  Neurological: Negative.  Negative for numbness and headaches.  Psychiatric/Behavioral: Negative.     Allergies as of 10/30/2019   No Known Allergies      Medication List       Accurate as of October 30, 2019  2:33 PM. If you have any questions, ask your nurse or doctor.        STOP taking these medications   predniSONE 20 MG tablet Commonly known as: Deltasone Replaced by: predniSONE 10 MG (48) Tbpk tablet Stopped by: November 01, 2019, PA-C     TAKE these medications   buPROPion 150 MG 24 hr tablet Commonly known as: Wellbutrin XL Take 1 tablet (150 mg total) by mouth daily.   citalopram 40 MG tablet Commonly known as: CeleXA Take 1 tablet (40 mg total) by mouth daily.   cyclobenzaprine 10 MG tablet Commonly known as: FLEXERIL Take 1 tablet (10 mg total) by mouth 3 (three) times daily as needed for muscle spasms. Started by: Remus Loffler, PA-C   ibuprofen 200 MG tablet Commonly known as: ADVIL Take 400-600 mg by mouth daily as needed. For pain   meloxicam 7.5 MG tablet Commonly known as: MOBIC Take 1 tablet (7.5 mg total) by mouth daily.   predniSONE 10 MG (48) Tbpk tablet Commonly known as: STERAPRED UNI-PAK 48 TAB Take as directed for 12 days Replaces: predniSONE 20 MG tablet Started by: Remus Loffler, PA-C   risperiDONE 2 MG tablet Commonly known as: RISPERDAL Take 1 tablet (2 mg total) by mouth at bedtime.  Objective:    BP 112/69   Pulse 72   Temp (!) 97.3 F (36.3 C) (Temporal)   Ht 5\' 11"  (1.803 m)   Wt 260 lb (117.9 kg)   SpO2 96%   BMI 36.26 kg/m   No Known Allergies  Wt Readings from Last 3 Encounters:  10/30/19 260 lb (117.9 kg)  10/13/19 269 lb (122 kg)  08/15/19 251 lb (113.9 kg)    Physical Exam Vitals signs and nursing note reviewed.  Constitutional:      General: He is not in acute distress.    Appearance: He is well-developed.  HENT:     Head: Normocephalic and atraumatic.  Eyes:     Conjunctiva/sclera: Conjunctivae normal.     Pupils: Pupils are equal, round, and reactive to light.  Cardiovascular:     Rate and Rhythm: Normal rate and regular rhythm.   Pulmonary:     Effort: Pulmonary effort is normal. No respiratory distress.     Breath sounds: Normal breath sounds.  Chest:     Chest wall: Tenderness present. No swelling, crepitus or edema.    Skin:    General: Skin is warm and dry.  Psychiatric:        Behavior: Behavior normal.         Assessment & Plan:   1. Rib pain on right side - predniSONE (STERAPRED UNI-PAK 48 TAB) 10 MG (48) TBPK tablet; Take as directed for 12 days  Dispense: 48 tablet; Refill: 0 - cyclobenzaprine (FLEXERIL) 10 MG tablet; Take 1 tablet (10 mg total) by mouth 3 (three) times daily as needed for muscle spasms.  Dispense: 30 tablet; Refill: 0 Deep breathing exercises with guarded chest, the activity is demonstrated to the patient. Per For 2-4 more weeks  Continue all other maintenance medications as listed above.  Follow up plan: No follow-ups on file.  Educational handout given for rib injury  Terald Sleeper PA-C [place] [address]  [place], [place] [address] [phone]   10/30/2019, 2:33 PM

## 2019-10-30 NOTE — Patient Instructions (Signed)

## 2019-11-08 ENCOUNTER — Other Ambulatory Visit: Payer: Self-pay | Admitting: Physician Assistant

## 2019-11-08 DIAGNOSIS — R0789 Other chest pain: Secondary | ICD-10-CM

## 2019-11-08 DIAGNOSIS — R0781 Pleurodynia: Secondary | ICD-10-CM

## 2020-01-12 ENCOUNTER — Telehealth: Payer: Self-pay | Admitting: Physician Assistant

## 2020-01-15 ENCOUNTER — Ambulatory Visit: Payer: Self-pay | Admitting: Physician Assistant

## 2020-01-18 ENCOUNTER — Telehealth: Payer: Self-pay

## 2020-01-18 NOTE — Telephone Encounter (Signed)
Patient no showed for appointment.  Left message to call back to schedule.

## 2020-01-24 ENCOUNTER — Encounter: Payer: Self-pay | Admitting: Physician Assistant

## 2021-09-01 ENCOUNTER — Encounter: Payer: Self-pay | Admitting: Nurse Practitioner

## 2021-09-01 ENCOUNTER — Other Ambulatory Visit: Payer: Self-pay

## 2021-09-01 ENCOUNTER — Ambulatory Visit: Payer: BC Managed Care – PPO | Admitting: Nurse Practitioner

## 2021-09-01 DIAGNOSIS — R369 Urethral discharge, unspecified: Secondary | ICD-10-CM | POA: Diagnosis not present

## 2021-09-01 DIAGNOSIS — R300 Dysuria: Secondary | ICD-10-CM

## 2021-09-01 LAB — URINALYSIS, COMPLETE
Bilirubin, UA: NEGATIVE
Glucose, UA: NEGATIVE
Ketones, UA: NEGATIVE
Nitrite, UA: NEGATIVE
Protein,UA: NEGATIVE
RBC, UA: NEGATIVE
Specific Gravity, UA: 1.025 (ref 1.005–1.030)
Urobilinogen, Ur: 0.2 mg/dL (ref 0.2–1.0)
pH, UA: 6.5 (ref 5.0–7.5)

## 2021-09-01 LAB — MICROSCOPIC EXAMINATION
RBC: NONE SEEN /hpf (ref 0–2)
Renal Epithel, UA: NONE SEEN /hpf

## 2021-09-01 MED ORDER — CEFTRIAXONE SODIUM 1 G IJ SOLR
1.0000 g | Freq: Once | INTRAMUSCULAR | Status: AC
  Administered 2021-09-02: 1 g via INTRAMUSCULAR

## 2021-09-01 NOTE — Progress Notes (Signed)
   Virtual Visit  Note Due to COVID-19 pandemic this visit was conducted virtually. This visit type was conducted due to national recommendations for restrictions regarding the COVID-19 Pandemic (e.g. social distancing, sheltering in place) in an effort to limit this patient's exposure and mitigate transmission in our community. All issues noted in this document were discussed and addressed.  A physical exam was not performed with this format.  I connected with Connor Crawford on 09/01/21 at 3:30 by telephone and verified that I am speaking with the correct person using two identifiers. Connor Crawford is currently located at home and no one  is currently with him during visit. The provider, Mary-Margaret Daphine Deutscher, FNP is located in their office at time of visit.  I discussed the limitations, risks, security and privacy concerns of performing an evaluation and management service by telephone and the availability of in person appointments. I also discussed with the patient that there may be a patient responsible charge related to this service. The patient expressed understanding and agreed to proceed.   History and Present Illness:   Chief Complaint: Urinary Tract Infection   HPI Patient states that he has had some buning and itchng when he voids. Has penile discharge which is greenish and milky looking. Admits to having a new sexual partner with unprotected sex in the last 2 weeks.   Review of Systems  Constitutional:  Negative for chills and fever.  Respiratory: Negative.    Cardiovascular: Negative.   Genitourinary: Negative.   Musculoskeletal: Negative.   Neurological: Negative.   Endo/Heme/Allergies: Negative.   All other systems reviewed and are negative.   Observations/Objective: Alert and oriented- answers all questions appropriately No distress Describes penile discharge as milky discharge   Assessment and Plan: Connor Crawford in today with chief complaint of Urinary Tract  Infection   1. Dysuria  - Urinalysis, Complete - added G/C  2. Penile discharge Avoid intercourse until test results are back Says he cannot come in today for rocephin shot- he may come in in morning. Will call with test results as soon as recieve    Follow Up Instructions: prn    I discussed the assessment and treatment plan with the patient. The patient was provided an opportunity to ask questions and all were answered. The patient agreed with the plan and demonstrated an understanding of the instructions.   The patient was advised to call back or seek an in-person evaluation if the symptoms worsen or if the condition fails to improve as anticipated.  The above assessment and management plan was discussed with the patient. The patient verbalized understanding of and has agreed to the management plan. Patient is aware to call the clinic if symptoms persist or worsen. Patient is aware when to return to the clinic for a follow-up visit. Patient educated on when it is appropriate to go to the emergency department.   Time call ended:  3:43  I provided 14 minutes of  non face-to-face time during this encounter.    Mary-Margaret Daphine Deutscher, FNP

## 2021-09-02 ENCOUNTER — Ambulatory Visit (INDEPENDENT_AMBULATORY_CARE_PROVIDER_SITE_OTHER): Payer: BC Managed Care – PPO | Admitting: *Deleted

## 2021-09-02 DIAGNOSIS — R369 Urethral discharge, unspecified: Secondary | ICD-10-CM

## 2021-09-02 DIAGNOSIS — R300 Dysuria: Secondary | ICD-10-CM

## 2021-09-02 MED ORDER — CEFTRIAXONE SODIUM 1 G IJ SOLR: 1.0000 g | Freq: Once | INTRAMUSCULAR | Status: DC

## 2021-09-02 NOTE — Progress Notes (Signed)
Patient in today for Rocephin injection ordered as result of office visit yesterday. Patient tolerated well.

## 2021-09-05 LAB — CT NG M GENITALIUM NAA, URINE
Chlamydia trachomatis, NAA: NEGATIVE
Mycoplasma genitalium NAA: NEGATIVE
Neisseria gonorrhoeae, NAA: POSITIVE — AB

## 2022-02-04 ENCOUNTER — Other Ambulatory Visit (HOSPITAL_COMMUNITY): Admission: RE | Admit: 2022-02-04 | Payer: Self-pay
